# Patient Record
Sex: Female | Born: 1980 | Race: Black or African American | Hispanic: No | Marital: Single | Smoking: Former smoker
Health system: Southern US, Community
[De-identification: ages and names within clinical notes are randomized; demographics above are authoritative.]

## PROBLEM LIST (undated history)

## (undated) DIAGNOSIS — F419 Anxiety disorder, unspecified: Secondary | ICD-10-CM

## (undated) DIAGNOSIS — N809 Endometriosis, unspecified: Secondary | ICD-10-CM

## (undated) HISTORY — DX: Endometriosis, unspecified: N80.9

## (undated) HISTORY — PX: NO PAST SURGERIES: SHX2092

## (undated) HISTORY — PX: ABDOMINAL HYSTERECTOMY: SHX81

## (undated) HISTORY — DX: Anxiety disorder, unspecified: F41.9

---
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
## 2002-10-12 ENCOUNTER — Emergency Department (HOSPITAL_COMMUNITY): Admission: EM | Admit: 2002-10-12 | Discharge: 2002-10-12 | Payer: Self-pay | Admitting: Emergency Medicine

## 2006-11-02 ENCOUNTER — Encounter: Payer: Self-pay | Admitting: Emergency Medicine

## 2006-11-03 ENCOUNTER — Ambulatory Visit: Payer: Self-pay | Admitting: Internal Medicine

## 2006-11-03 ENCOUNTER — Inpatient Hospital Stay (HOSPITAL_COMMUNITY): Admission: AD | Admit: 2006-11-03 | Discharge: 2006-11-08 | Payer: Self-pay | Admitting: Obstetrics and Gynecology

## 2006-11-03 ENCOUNTER — Ambulatory Visit: Payer: Self-pay | Admitting: Obstetrics & Gynecology

## 2006-11-27 ENCOUNTER — Ambulatory Visit: Payer: Self-pay | Admitting: *Deleted

## 2006-12-13 ENCOUNTER — Emergency Department (HOSPITAL_COMMUNITY): Admission: EM | Admit: 2006-12-13 | Discharge: 2006-12-13 | Payer: Self-pay | Admitting: Emergency Medicine

## 2007-04-04 ENCOUNTER — Emergency Department (HOSPITAL_COMMUNITY): Admission: EM | Admit: 2007-04-04 | Discharge: 2007-04-04 | Payer: Self-pay | Admitting: Family Medicine

## 2007-08-13 ENCOUNTER — Ambulatory Visit: Payer: Self-pay | Admitting: Obstetrics and Gynecology

## 2007-09-01 ENCOUNTER — Emergency Department (HOSPITAL_COMMUNITY): Admission: EM | Admit: 2007-09-01 | Discharge: 2007-09-01 | Payer: Self-pay | Admitting: Emergency Medicine

## 2008-07-20 ENCOUNTER — Ambulatory Visit: Payer: Self-pay | Admitting: Obstetrics & Gynecology

## 2008-07-21 ENCOUNTER — Encounter: Payer: Self-pay | Admitting: Obstetrics and Gynecology

## 2010-05-08 LAB — POCT URINALYSIS DIP (DEVICE)
Bilirubin Urine: NEGATIVE
Ketones, ur: NEGATIVE mg/dL
Specific Gravity, Urine: 1.02 (ref 1.005–1.030)

## 2010-06-13 NOTE — Group Therapy Note (Signed)
[name], TOROSYAN NO.:  [id number]   MEDICAL RECORD NO.:  [id number]          PATIENT TYPE:  WOC   LOCATION:  [facility]                   FACILITY:  [facility]   PHYSICIAN:  Argentina Donovan, MD        DATE OF BIRTH:  1980/11/29   DATE OF SERVICE:  08/13/2007                                  CLINIC NOTE   This is a young Caucasian female at 30 years old, nulligravida, who was  hospitalized one year ago for treatment of pneumonia, Gonorrhea, urinary  tract infection  and pelvic inflammatory disease.  It was suspected  bilateral tubal ovarian abscesses.  Patient has been asymptomatic since  that time, but comes in wanting STD testing.  GC culture probe and wet  prep were done, and the patient will be sent for HIV, RPR and hepatitis  screen.  Ultrasound followup from last year to see if there is any  significant change at the patient's request.   IMPRESSION:  History of pelvic inflammatory disease for sexually  transmitted disease testing.           ______________________________  Argentina Donovan, MD     PR/MEDQ  D:  08/13/2007  T:  08/13/2007  Job:  [id number]

## 2010-06-13 NOTE — Group Therapy Note (Signed)
NAMELYLE, LEISNER NO.:  [id number]   MEDICAL RECORD NO.:  [id number]           PATIENT TYPE:   LOCATION:  [facility]                     FACILITY:   PHYSICIAN:  Karlton Lemon, MD      DATE OF BIRTH:  [birth year]   DATE OF SERVICE:  11/27/2006                                  CLINIC NOTE   CHIEF COMPLAINT:  Followup from hospitalization.   HISTORY OF PRESENT ILLNESS:  This is a 30 year old gravida 0 presenting  for followup from hospitalization from October 6 through October 10  involving treatment for pneumonia, gonorrhea, urinary tract infection  and pelvic inflammatory disease including suspected bilateral tubo-  ovarian abscesses. At time of discharge, it was determined that abnormal  findings on ultrasound and CT in the ovaries were likely bilateral  hydrosalpinges. The patient was treated with multiple antibiotics while  inpatient and had some time spent in the ICU due to her critical  condition. She was discharged on Levaquin for 5 more days post  hospitalization. The patient reports that she has been doing well and  denies current abdominal pain. The patient states that she did have some  vaginal bleeding lasting about four days that was described as spotting.  She is currently not having any vaginal bleeding. Her last menstrual  period was on October 16, 2006. The patient reports that she is having  very mild abdominal pain. She states that her partner was treated for  gonorrhea with an IM injection of antibiotics, likely Rocephin, but she  did have sex with him once prior to him being treated. She has had sex  with him a few times since his being treated. She has no other  complaints today. She denies fever, rash, nausea, vomiting, diarrhea or  abdominal pain.   PAST MEDICAL HISTORY:  Recently treated gonorrhea, pelvic inflammatory  disease, pneumonia, urinary tract infection. She has no other medical  problems.   PAST SURGICAL HISTORY:   None.   MEDICATIONS:  None currently.   ALLERGIES:  No known drug allergies.   SOCIAL HISTORY:  The patient does smoke a half a pack per day for  several years. She denies alcohol or drug use.   FAMILY HISTORY:  Noncontributory.   REVIEW OF SYSTEMS:  Review of systems is negative except for what is  stated above in the history of present illness.   PHYSICAL EXAMINATION:  GENERAL:  This is a well-appearing female in no  distress.  VITAL SIGNS:  Temperature is 97.8, pulse is 79, blood pressure 117/73.  CARDIOVASCULAR:  Heart is regular rate and rhythm. No murmurs, rubs or  gallops are appreciated.  RESPIRATORY:  Lungs are clear to auscultation bilaterally.  ABDOMEN:  Soft with very mild tenderness in the suprapubic region. There  is no rebound tenderness or guarding. No masses are palpated. Bowel  sounds are auscultated in all four quadrants.  GENITOURINARY:  Normal female external genitalia. Vaginal mucosa is pink  and moist. There is a whitish discharge noted. There is no discharge  from the cervical os. GC and chlamydia is collected from the cervical  os. On bimanual exam,  there is no cervical motion tenderness. Uterus is  normal size and midline. Adnexa are difficult to palpate but do not feel  enlarged.  EXTREMITIES:  No clubbing, cyanosis, or edema.   ASSESSMENT AND PLAN:  This is a 30 year old gravida 0 with history of  hydrosalpinges, pneumonia, urinary tract infection, gonorrhea during  recent hospitalization.  She has completed outpatient course of Levaquin  post hospitalization.   1. Cervix has been recultured today for gonorrhea and chlamydia.  2. Will check a CBC and ESR to monitor for any inflammation due to her      previous findings on CT and ultrasound suggestive of ovarian      abscesses, even though she was discharged with the diagnoses of      hydrosalpinges. We will follow her on an as needed basis.  3. The patient has been counseled to practice safe sex  with a      monogamous partner as this will decrease her likelihood of having a      recurrence of these infections, particularly gonorrhea and possible      pelvic inflammatory disease and tubo-ovarian abscess. The patient      expresses understanding of his plan.           ______________________________  Karlton Lemon, MD     NS/MEDQ  D:  11/27/2006  T:  11/28/2006  Job:  [id number]

## 2010-06-13 NOTE — Discharge Summary (Signed)
Megan Ponce, Megan Ponce              ACCOUNT NO.:  [id number]   MEDICAL RECORD NO.:  [id number]          PATIENT TYPE:  INP   LOCATION:  9304                          FACILITY:  [facility]   PHYSICIAN:  Norton Blizzard, MD    DATE OF BIRTH:  24-Feb-1980   DATE OF ADMISSION:  11/03/2006  DATE OF DISCHARGE:                               DISCHARGE SUMMARY   ADMISSION DIAGNOSES:  1. Bilateral tuboovarian abscess.  2. Pelvic inflammatory disease.   DISCHARGE DIAGNOSES:  1. Bilateral pyosalpinges.  2. Pneumonia.  3. Urinary tract infections.  4. Gonorrhea infection.   HISTORY OF PRESENT ILLNESS:  The patient is a 30 year old G0 who  presented to the ED on 10/5, with complaint of lower abdominal pain.  She was noted to have bilateral adnexal masses concerning for a TOA  on  a CT scan and labs were remarkable for a white count of 32.8 with a left  shift.  Of note, she was also noted to have cervical motion tenderness.  Given this concern for her pain and possible tuboovarian abscess, she  was admitted to the hospital for further management and evaluation.   PAST MEDICAL HISTORY:  None.   PAST SURGICAL HISTORY:  None.   PAST OB HISTORY:  The patient has a history of Chlamydia 4 years ago,  abnormal Pap smear in March 2008.  Does not use any contraception.   MEDICATIONS:  None.   ALLERGIES:  NO KNOWN DRUG ALLERGIES.   SOCIAL HISTORY:  Smoking half pack per day for many years.  No alcohol  or drugs.   FAMILY HISTORY:  Noncontributory.   ADMISSION PHYSICAL EXAM:  Temperature 98.4, blood pressure 118/20, pulse  100 and oxygen saturation 98%.  In general in no apparent distress.  Heart regular rate and rhythm.  Lungs clear to auscultation bilaterally.  Abdomen soft, tenderness to palpation bilateral lower quadrants, no  rebound or guarding.  Positive bowel sounds.  Extremities are  symmetrical, nontender.  On pelvic exam, positive cervical motion  tenderness.  Adnexa tender to palpation,  right greater than left,  midline tenderness, malodorous discharge also noted.   PERTINENT ADMISSION LABS:  White count of 32.8 with 96% neutrophils.  Urine HCG negative.  UA with positive nitrites.  Wet prep with many clue  cells.  CT scan shows bilateral thick adnexal lesions and thickened tubal walls  suspicious for TOA.   HOSPITAL COURSE:  The patient was hospitalized on 11/03/06.  She was  initially started on Cefotetan, Doxycycline and Flagyl for treatment of  her TOA, presumed UTI and bacterial vaginosis.  On 11/04/06, the patient  was noted to have oxygen saturation in the high 80s to low 90s,  persistent tachycardia and a fever to 100.7.  Given these symptoms, a  chest  CT scan was obtained which was negative for a pulmonary embolus  but positive for a right upper and right lower lobe consolidation giving  the patient a new diagnosis of pneumonia.  Moreover, her cervical  cultures were positive for gonorrhea and her urine culture was positive  an E. coli UTI.  The  patient received Ceftriaxone 125 mg IM x 1 for her  gonorrhea, and her antibotics were changed to Levofloxacin, Gentamicin  and Flagyl. Given concern for her multiple infections and unstable vital  signs, she was moved to the intensive care unit for close monitoring.  In The ICU, a Critical Care consult was obtained; Dr. Sherene Sires recommended  oxygen therapy as needed to maintain adequate saturation and IV Zosyn as  monotherapy for her multiple infectious processes. The patient was  maintained on IV antibiotics for over 48 hours, during which time she  remained afebrile.  The patient's condition did improve and she was  transferred  back to the floor on 11/07/06.  At the time of discharge on  the10/9/08,  she had been afebrile for over 48 hours.  She had minimal  pain and was maintaining oxygen saturations above 94% on room air.  The  plan as per Dr. Sherene Sires was to continue her Levofloxacin for 5 more days  after discharge.    CONSULTANTS:  Critical care.   PROCEDURES:  None.   DISCHARGE MEDICATIONS:  1. Levofloxacin 750 mg orally daily x 5 days.  2. Vicodin 5/325 mg 1 to 2 tabs orally q. 4 hours p.r.n. pain.  3. Motrin 800 mg orally q. 8 hours p.r.n., pain.   DISCHARGE INSTRUCTIONS:  The patient was instructed to follow up in the  GYN clinic in 2 weeks.  She was given the number to the GYN clinic to  make an appointment to be seen in followup.  She was also instructed  about the impact of her pyosalpinges on her possible infertility in the  future, and probable need for surgery in the future.  Of note, the  patient was instructed to have her boyfriend treated for gonorrhea and  at the time of discharge she did report that her boyfriend had been to  the health department and received appropriate treatment.  The patient  was told to call for worsening shortness of breath, fevers or any other  concerns.      Norton Blizzard, MD  Electronically Signed     UAD/MEDQ  D:  11/08/2006  T:  11/08/2006  Job:  [id number]

## 2010-10-27 LAB — CBC
HCT: 37.6
MCV: 91.4
Platelets: 235
RBC: 4.12
RDW: 12.6

## 2010-10-27 LAB — URINALYSIS, ROUTINE W REFLEX MICROSCOPIC
Glucose, UA: NEGATIVE
Nitrite: POSITIVE — AB
Protein, ur: 100 — AB
Specific Gravity, Urine: 1.036 — ABNORMAL HIGH
Urobilinogen, UA: 1

## 2010-10-27 LAB — URINE MICROSCOPIC-ADD ON

## 2010-10-27 LAB — URINE CULTURE

## 2010-10-27 LAB — POCT I-STAT, CHEM 8
BUN: 16
Creatinine, Ser: 1.1
TCO2: 20

## 2010-10-27 LAB — DIFFERENTIAL
Basophils Absolute: 0
Basophils Relative: 0
Eosinophils Absolute: 0
Lymphs Abs: 1.2
Neutro Abs: 16.7 — ABNORMAL HIGH
Neutrophils Relative %: 88 — ABNORMAL HIGH

## 2010-10-27 LAB — POCT PREGNANCY, URINE: Preg Test, Ur: NEGATIVE

## 2010-11-07 LAB — CULTURE, ROUTINE-ABSCESS

## 2010-11-08 LAB — POCT URINALYSIS DIP (DEVICE)
Glucose, UA: NEGATIVE
Nitrite: NEGATIVE
Operator id: 297281
Specific Gravity, Urine: >=1.03
Urobilinogen, UA: 0.2

## 2010-11-09 LAB — DIFFERENTIAL
Basophils Absolute: 0
Basophils Absolute: 0
Basophils Absolute: 0
Basophils Relative: 0
Basophils Relative: 0
Eosinophils Relative: 0
Lymphocytes Relative: 3 — ABNORMAL LOW
Lymphocytes Relative: 4 — ABNORMAL LOW
Lymphs Abs: 0.6 — ABNORMAL LOW
Lymphs Abs: 0.7
Monocytes Absolute: 0.3
Monocytes Absolute: 0.5
Monocytes Absolute: 0.7
Neutro Abs: 16.2 — ABNORMAL HIGH
Neutro Abs: 22.7 — ABNORMAL HIGH
Neutro Abs: 31.4 — ABNORMAL HIGH
Neutro Abs: 6.3
WBC Morphology: INCREASED

## 2010-11-09 LAB — HEPATITIS B E ANTIGEN: Hep B E Ag: NEGATIVE

## 2010-11-09 LAB — CBC
HCT: 28.5 — ABNORMAL LOW
HCT: 40.7
Hemoglobin: 11.3 — ABNORMAL LOW
Hemoglobin: 12.1
Hemoglobin: 13.7
Hemoglobin: 9.8 — ABNORMAL LOW
MCV: 90.3
MCV: 90.4
Platelets: 320
RBC: 3.17 — ABNORMAL LOW
RBC: 3.98
RDW: 13.1
RDW: 13.3
WBC: 17.5 — ABNORMAL HIGH
WBC: 23.5 — ABNORMAL HIGH
WBC: 29.4 — ABNORMAL HIGH
WBC: 8

## 2010-11-09 LAB — AMYLASE: Amylase: 65

## 2010-11-09 LAB — COMPREHENSIVE METABOLIC PANEL
Albumin: 2.2 — ABNORMAL LOW
Alkaline Phosphatase: 49
BUN: <1 — ABNORMAL LOW
Chloride: 105
Glucose, Bld: 114 — ABNORMAL HIGH
Potassium: 3.5
Total Bilirubin: 0.6

## 2010-11-09 LAB — URINE CULTURE

## 2010-11-09 LAB — URINALYSIS, ROUTINE W REFLEX MICROSCOPIC
Bilirubin Urine: NEGATIVE
Glucose, UA: NEGATIVE
Ketones, ur: 15 — AB
Protein, ur: 100 — AB
Specific Gravity, Urine: 1.023

## 2010-11-09 LAB — RPR: RPR Ser Ql: NONREACTIVE

## 2010-11-09 LAB — HIV ANTIBODY (ROUTINE TESTING W REFLEX): HIV: NONREACTIVE

## 2010-11-09 LAB — I-STAT 8, (EC8 V) (CONVERTED LAB)
BUN: 11
Bicarbonate: 25.2 — ABNORMAL HIGH
Chloride: 105
HCT: 45
Operator id: 272551
Potassium: 3.9
pH, Ven: 7.345 — ABNORMAL HIGH

## 2010-11-09 LAB — GC/CHLAMYDIA PROBE AMP, GENITAL
Chlamydia, DNA Probe: NEGATIVE
GC Probe Amp, Genital: POSITIVE — AB

## 2010-11-09 LAB — CULTURE, RESPIRATORY W GRAM STAIN
Culture: NORMAL
Gram Stain: NONE SEEN

## 2010-11-09 LAB — POCT I-STAT CREATININE: Operator id: 272551

## 2010-11-09 LAB — PREGNANCY, URINE: Preg Test, Ur: NEGATIVE

## 2010-11-09 LAB — WET PREP, GENITAL
Trich, Wet Prep: NONE SEEN
Yeast Wet Prep HPF POC: NONE SEEN

## 2010-11-09 LAB — URINE MICROSCOPIC-ADD ON

## 2010-11-09 LAB — RAPID URINE DRUG SCREEN, HOSP PERFORMED: Tetrahydrocannabinol: NOT DETECTED

## 2014-05-23 ENCOUNTER — Emergency Department (HOSPITAL_COMMUNITY)
Admission: EM | Admit: 2014-05-23 | Discharge: 2014-05-23 | Disposition: A | Discharge status: Home / Self Care | Payer: Self-pay | Attending: Emergency Medicine | Admitting: Emergency Medicine

## 2014-05-23 ENCOUNTER — Emergency Department (HOSPITAL_COMMUNITY): Payer: Self-pay

## 2014-05-23 ENCOUNTER — Encounter (HOSPITAL_COMMUNITY): Payer: Self-pay | Admitting: *Deleted

## 2014-05-23 DIAGNOSIS — K0889 Other specified disorders of teeth and supporting structures: Secondary | ICD-10-CM

## 2014-05-23 DIAGNOSIS — K5900 Constipation, unspecified: Secondary | ICD-10-CM | POA: Insufficient documentation

## 2014-05-23 DIAGNOSIS — K088 Other specified disorders of teeth and supporting structures: Secondary | ICD-10-CM | POA: Insufficient documentation

## 2014-05-23 DIAGNOSIS — N390 Urinary tract infection, site not specified: Secondary | ICD-10-CM | POA: Insufficient documentation

## 2014-05-23 DIAGNOSIS — R109 Unspecified abdominal pain: Secondary | ICD-10-CM

## 2014-05-23 DIAGNOSIS — R220 Localized swelling, mass and lump, head: Secondary | ICD-10-CM

## 2014-05-23 DIAGNOSIS — Z720 Tobacco use: Secondary | ICD-10-CM | POA: Insufficient documentation

## 2014-05-23 DIAGNOSIS — Z3202 Encounter for pregnancy test, result negative: Secondary | ICD-10-CM | POA: Insufficient documentation

## 2014-05-23 LAB — COMPREHENSIVE METABOLIC PANEL
ALT: 17 U/L (ref 0–35)
ANION GAP: 10 (ref 5–15)
AST: 18 U/L (ref 0–37)
Albumin: 3.6 g/dL (ref 3.5–5.2)
Alkaline Phosphatase: 60 U/L (ref 39–117)
BUN: 9 mg/dL (ref 6–23)
CALCIUM: 8.7 mg/dL (ref 8.4–10.5)
CHLORIDE: 108 mmol/L (ref 96–112)
CO2: 19 mmol/L (ref 19–32)
CREATININE: 0.85 mg/dL (ref 0.50–1.10)
GFR calc non Af Amer: 89 mL/min — ABNORMAL LOW (ref >90)
Glucose, Bld: 86 mg/dL (ref 70–99)
Potassium: 4 mmol/L (ref 3.5–5.1)
SODIUM: 137 mmol/L (ref 135–145)
Total Bilirubin: 0.7 mg/dL (ref 0.3–1.2)
Total Protein: 7 g/dL (ref 6.0–8.3)

## 2014-05-23 LAB — URINALYSIS, ROUTINE W REFLEX MICROSCOPIC
Bilirubin Urine: NEGATIVE
GLUCOSE, UA: NEGATIVE mg/dL
HGB URINE DIPSTICK: NEGATIVE
KETONES UR: NEGATIVE mg/dL
Leukocytes, UA: NEGATIVE
Nitrite: POSITIVE — AB
PH: 6 (ref 5.0–8.0)
PROTEIN: NEGATIVE mg/dL
Specific Gravity, Urine: 1.021 (ref 1.005–1.030)
UROBILINOGEN UA: 0.2 mg/dL (ref 0.0–1.0)

## 2014-05-23 LAB — CBC WITH DIFFERENTIAL/PLATELET
BASOS ABS: 0 10*3/uL (ref 0.0–0.1)
Basophils Relative: 0 % (ref 0–1)
EOS ABS: 0.1 10*3/uL (ref 0.0–0.7)
EOS PCT: 1 % (ref 0–5)
HEMATOCRIT: 37.8 % (ref 36.0–46.0)
Hemoglobin: 12.8 g/dL (ref 12.0–15.0)
Lymphocytes Relative: 14 % (ref 12–46)
Lymphs Abs: 1.5 10*3/uL (ref 0.7–4.0)
MCH: 30.2 pg (ref 26.0–34.0)
MCHC: 33.9 g/dL (ref 30.0–36.0)
MCV: 89.2 fL (ref 78.0–100.0)
MONOS PCT: 6 % (ref 3–12)
Monocytes Absolute: 0.6 10*3/uL (ref 0.1–1.0)
NEUTROS ABS: 8.8 10*3/uL — AB (ref 1.7–7.7)
NEUTROS PCT: 79 % — AB (ref 43–77)
Platelets: 314 10*3/uL (ref 150–400)
RBC: 4.24 MIL/uL (ref 3.87–5.11)
RDW: 12.9 % (ref 11.5–15.5)
WBC: 11 10*3/uL — AB (ref 4.0–10.5)

## 2014-05-23 LAB — URINE MICROSCOPIC-ADD ON

## 2014-05-23 LAB — POC URINE PREG, ED: PREG TEST UR: NEGATIVE

## 2014-05-23 MED ORDER — POLYETHYLENE GLYCOL 3350 17 GM/SCOOP PO POWD: 17.0000 g | Freq: Two times a day (BID) | ORAL | Status: DC

## 2014-05-23 MED ORDER — CEPHALEXIN 500 MG PO CAPS: 500.0000 mg | ORAL_CAPSULE | Freq: Four times a day (QID) | ORAL | Status: DC

## 2014-05-23 MED ORDER — ACETAMINOPHEN 500 MG PO TABS
500.0000 mg | ORAL_TABLET | Freq: Once | ORAL | Status: AC
  Administered 2014-05-23: 500 mg via ORAL
  Filled 2014-05-23: qty 1

## 2014-05-23 NOTE — Discharge Instructions (Signed)
Dental Pain A tooth ache may be caused by cavities (tooth decay). Cavities expose the nerve of the tooth to air and hot or cold temperatures. It may come from an infection or abscess (also called a boil or furuncle) around your tooth. It is also often caused by dental caries (tooth decay). This causes the pain you are having. DIAGNOSIS  Your caregiver can diagnose this problem by exam. TREATMENT   If caused by an infection, it may be treated with medications which kill germs (antibiotics) and pain medications as prescribed by your caregiver. Take medications as directed.  Only take over-the-counter or prescription medicines for pain, discomfort, or fever as directed by your caregiver.  Whether the tooth ache today is caused by infection or dental disease, you should see your dentist as soon as possible for further care. SEEK MEDICAL CARE IF: The exam and treatment you received today has been provided on an emergency basis only. This is not a substitute for complete medical or dental care. If your problem worsens or new problems (symptoms) appear, and you are unable to meet with your dentist, call or return to this location. SEEK IMMEDIATE MEDICAL CARE IF:   You have a fever.  You develop redness and swelling of your face, jaw, or neck.  You are unable to open your mouth.  You have severe pain uncontrolled by pain medicine. MAKE SURE YOU:   Understand these instructions.  Will watch your condition.  Will get help right away if you are not doing well or get worse. Document Released: 01/15/2005 Document Revised: 04/09/2011 Document Reviewed: 09/03/2007 [facility] Patient Information 2015 [place], [place]. This information is not intended to replace advice given to you by your health care provider. Make sure you discuss any questions you have with your health care provider. Constipation Constipation is when a person has fewer than three bowel movements a week, has difficulty having a bowel  movement, or has stools that are dry, hard, or larger than normal. As people grow older, constipation is more common. If you try to fix constipation with medicines that make you have a bowel movement (laxatives), the problem may get worse. Long-term laxative use may cause the muscles of the colon to become weak. A low-fiber diet, not taking in enough fluids, and taking certain medicines may make constipation worse.  CAUSES   Certain medicines, such as antidepressants, pain medicine, iron supplements, antacids, and water pills.   Certain diseases, such as diabetes, irritable bowel syndrome (IBS), thyroid disease, or depression.   Not drinking enough water.   Not eating enough fiber-rich foods.   Stress or travel.   Lack of physical activity or exercise.   Ignoring the urge to have a bowel movement.   Using laxatives too much.  SIGNS AND SYMPTOMS   Having fewer than three bowel movements a week.   Straining to have a bowel movement.   Having stools that are hard, dry, or larger than normal.   Feeling full or bloated.   Pain in the lower abdomen.   Not feeling relief after having a bowel movement.  DIAGNOSIS  Your health care provider will take a medical history and perform a physical exam. Further testing may be done for severe constipation. Some tests may include:  A barium enema X-ray to examine your rectum, colon, and, sometimes, your small intestine.   A sigmoidoscopy to examine your lower colon.   A colonoscopy to examine your entire colon. TREATMENT  Treatment will depend on the severity of  your constipation and what is causing it. Some dietary treatments include drinking more fluids and eating more fiber-rich foods. Lifestyle treatments may include regular exercise. If these diet and lifestyle recommendations do not help, your health care provider may recommend taking over-the-counter laxative medicines to help you have bowel movements. Prescription  medicines may be prescribed if over-the-counter medicines do not work.  HOME CARE INSTRUCTIONS   Eat foods that have a lot of fiber, such as fruits, vegetables, whole grains, and beans.  Limit foods high in fat and processed sugars, such as french fries, hamburgers, cookies, candies, and soda.   A fiber supplement may be added to your diet if you cannot get enough fiber from foods.   Drink enough fluids to keep your urine clear or pale yellow.   Exercise regularly or as directed by your health care provider.   Go to the restroom when you have the urge to go. Do not hold it.   Only take over-the-counter or prescription medicines as directed by your health care provider. Do not take other medicines for constipation without talking to your health care provider first.  SEEK IMMEDIATE MEDICAL CARE IF:   You have bright red blood in your stool.   Your constipation lasts for more than 4 days or gets worse.   You have abdominal or rectal pain.   You have thin, pencil-like stools.   You have unexplained weight loss. MAKE SURE YOU:   Understand these instructions.  Will watch your condition.  Will get help right away if you are not doing well or get worse. Document Released: 10/14/2003 Document Revised: 01/20/2013 Document Reviewed: 10/27/2012 [facility] Patient Information 2015 [place], [place]. This information is not intended to replace advice given to you by your health care provider. Make sure you discuss any questions you have with your health care provider. Urinary Tract Infection Urinary tract infections (UTIs) can develop anywhere along your urinary tract. Your urinary tract is your body's drainage system for removing wastes and extra water. Your urinary tract includes two kidneys, two ureters, a bladder, and a urethra. Your kidneys are a pair of bean-shaped organs. Each kidney is about the size of your fist. They are located below your ribs, one on each side of your  spine. CAUSES Infections are caused by microbes, which are microscopic organisms, including fungi, viruses, and bacteria. These organisms are so small that they can only be seen through a microscope. Bacteria are the microbes that most commonly cause UTIs. SYMPTOMS  Symptoms of UTIs may vary by age and gender of the patient and by the location of the infection. Symptoms in young women typically include a frequent and intense urge to urinate and a painful, burning feeling in the bladder or urethra during urination. Older women and men are more likely to be tired, shaky, and weak and have muscle aches and abdominal pain. A fever may mean the infection is in your kidneys. Other symptoms of a kidney infection include pain in your back or sides below the ribs, nausea, and vomiting. DIAGNOSIS To diagnose a UTI, your caregiver will ask you about your symptoms. Your caregiver also will ask to provide a urine sample. The urine sample will be tested for bacteria and white blood cells. White blood cells are made by your body to help fight infection. TREATMENT  Typically, UTIs can be treated with medication. Because most UTIs are caused by a bacterial infection, they usually can be treated with the use of antibiotics. The choice of antibiotic  and length of treatment depend on your symptoms and the type of bacteria causing your infection. HOME CARE INSTRUCTIONS  If you were prescribed antibiotics, take them exactly as your caregiver instructs you. Finish the medication even if you feel better after you have only taken some of the medication.  Drink enough water and fluids to keep your urine clear or pale yellow.  Avoid caffeine, tea, and carbonated beverages. They tend to irritate your bladder.  Empty your bladder often. Avoid holding urine for long periods of time.  Empty your bladder before and after sexual intercourse.  After a bowel movement, women should cleanse from front to back. Use each tissue only  once. SEEK MEDICAL CARE IF:   You have back pain.  You develop a fever.  Your symptoms do not begin to resolve within 3 days. SEEK IMMEDIATE MEDICAL CARE IF:   You have severe back pain or lower abdominal pain.  You develop chills.  You have nausea or vomiting.  You have continued burning or discomfort with urination. MAKE SURE YOU:   Understand these instructions.  Will watch your condition.  Will get help right away if you are not doing well or get worse. Document Released: 10/25/2004 Document Revised: 07/17/2011 Document Reviewed: 02/23/2011 [facility] Patient Information 2015 [place]. This information is not intended to replace advice given to you by your health care provider. Make sure you discuss any questions you have with your health care provider.   Emergency Department Resource Guide 1) Find a Doctor and Pay Out of Pocket Although you won't have to find out who is covered by your insurance plan, it is a good idea to ask around and get recommendations. You will then need to call the office and see if the doctor you have chosen will accept you as a new patient and what types of options they offer for patients who are self-pay. Some doctors offer discounts or will set up payment plans for their patients who do not have insurance, but you will need to ask so you aren't surprised when you get to your appointment.  2) Contact Your Local Health Department Not all health departments have doctors that can see patients for sick visits, but many do, so it is worth a call to see if yours does. If you don't know where your local health department is, you can check in your phone book. The CDC also has a tool to help you locate your state's health department, and many state websites also have listings of all of their local health departments.  3) Find a Walk-in Clinic If your illness is not likely to be very severe or complicated, you may want to try a walk in clinic. These are  popping up all over the country in pharmacies, drugstores, and shopping centers. They're usually staffed by nurse practitioners or physician assistants that have been trained to treat common illnesses and complaints. They're usually fairly quick and inexpensive. However, if you have serious medical issues or chronic medical problems, these are probably not your best option.  No Primary Care Doctor: - Call Health Connect at  [phone] - they can help you locate a primary care doctor that  accepts your insurance, provides certain services, etc. - Physician Referral Service- [phone]  Chronic Pain Problems: Organization         Address  Phone   Notes  [facility]  [phone] Patients need to be referred by their primary care doctor.   Medication  Assistance: Organization         Address  Phone   Notes  [facility] Medication Assistance Program [address] [place] [place], [place] [address] [phone] --Must be a resident of [facility] -- Must have NO insurance coverage whatsoever (no Medicaid/ Medicare, etc.) -- The pt. MUST have a primary care doctor that directs their care regularly and follows them in the community   [facility]  [phone]   [employer]  [phone]    Agencies that provide inexpensive medical care: Organization         Address  Phone   Notes  [facility]  [phone]   [facility]    [phone]   [facility] [address] [place], [place] [address] [phone]   [facility] [address] [place] [place] [phone]   Planned Parenthood    [phone]   [facility]    [phone]   [facility]  [address], [place] Phone:  [phone], Fax:  [phone] Hours of Operation:  9 am - 6 pm, M-F.  Also accepts Medicaid/Medicare and self-pay.  [facility]  [address], [place] Phone: [phone], Fax: [phone]. Hours of Operation:  8:30 am - 5:30 pm, M-F.  Also accepts Medicaid and self-pay.  [facility] [place] [address] [place] Phone: [phone]   [facility] [address] Natasha Bence [place], [place] [phone], Ext. [phone] Mondays & Thursdays: 7-9 AM.  First 15 patients are seen on a first come, first serve basis.    Medicaid-accepting [facility] Providers:  Organization         Address  Phone   Notes  [name] [address], [place] [phone] Also accepts self-pay patients.  [facility] [address] Laurell Josephs [place], [place]  [phone]   [facility] [address], [place] [phone]   [facility] [address], [place] [phone]   Renaye Rakers [address], [place]   [phone] Only accepts [place] Access [place] patients after they have their name applied to their card.   Self-Pay (no insurance) in [facility]:  Organization         Address  Phone   Notes  Sickle Cell Patients, [facility] [address] [place], [place] [phone]   [facility] [address] [place], [place] [phone]   [facility]  [address], [place] [phone]   [facility]/Dr. Osei-Bonsu  [address] [place] or [address], [place] [phone] Phone number for both [place] and [place] locations is the same.  [facility] [address], [place] [phone]   [facility] [address] [place] or [address] [place] [address] [phone]   [facility] [place] [address], [place] [phone], phone; [phone], fax Sees patients 1st and 3rd Saturday  of every month.  Must not qualify for public or private insurance (i.e. Medicaid, Medicare, [place] Health Choice, Veterans' Benefits)  Household income should be no more than 200% of the poverty level The clinic  cannot treat you if you are pregnant or think you are pregnant  Sexually transmitted diseases are not treated at the clinic.    Dental Care: Organization         Address  Phone  Notes  [facility] Department of [facility] [address] [place], [place] [phone] Accepts children up to age 66 who are enrolled in [place] or [place] Health Choice; pregnant women with a Medicaid card; and children who have applied for Medicaid or [place] Health Choice, but were declined, whose parents can pay a reduced fee at time of service.  [facility] Department of [facility]  [address], [place] [phone] Accepts children up to age 76 who are enrolled in [place] or [place] Health Choice; pregnant women with a Medicaid card; and children who have applied for Medicaid or [place] Health Choice, but were declined, whose parents can pay a reduced fee at time of service.  [facility]  [address] [place], [place] [phone] Patients are seen by appointment only. Walk-ins are not accepted. [facility] will see patients 50 years of age and older. Monday - Tuesday (8am-5pm) Most Wednesdays (8:30-5pm) $30 per visit, cash only  [facility]  [address], [place] [facility] [phone] Patients are seen by appointment only. Walk-ins are not accepted. [facility] will see patients 58 years of age and older. One Wednesday Evening (Monthly: Volunteer Based).  $30 per visit, cash only  [employer]  [phone] for adults; Children under age 46, call Graduate Pediatric Dentistry at [phone]. Children aged 33-14, please call [phone] to request a pediatric application.   Dental services are provided in all areas of dental care including fillings, crowns and bridges, complete and partial dentures, implants, gum treatment, root canals, and extractions. Preventive care is also provided. Treatment is provided to both adults and children. Patients are selected via a lottery and there is often a waiting list.   [facility] [address] [place]  [phone] [email or web address]   [facility] [address] [place], [place] [phone], Ext. [phone] Second and Fourth Thursday of each month, opens at 6:30 AM; Clinic ends at 9 AM.  Patients are seen on a first-come first-served basis, and a limited number are seen during each clinic.   [facility]  [address] [place] [employer], [place] [phone]   Eligibility Requirements You must have lived in [place], [place], or [place] counties for at least the last three months.   You cannot be eligible for state or federal sponsored [place], including [employer], [place], or [employer].   You generally cannot be eligible for healthcare insurance through your employer.    How to apply: Eligibility screenings are held every Tuesday and Wednesday afternoon from 1:00 pm until 4:00 pm. You do not need an appointment for the interview!  [facility] [address], [place], [place] [phone]   [facility] Health Department  [phone]   [facility] Health Department  [phone]   [facility] Health Department  [phone]    Behavioral Health Resources in the Community: Intensive Outpatient Programs Organization         Address  Phone  Notes  [facility] Services [address], [place], [place] [phone]   [facility] [address], [place], [place] [phone]  [facility] [address] [place], [place]  [phone]   [facility] [address],  [place], [place] [phone] or [phone]   Substance Abuse Resources Organization         Address  Phone  Notes  [facility]  [phone]   [facility]  [phone]   [facility] [place]  [phone]   Floydene Flock  [phone]   Residential & Outpatient Substance Abuse Program  [phone]   Psychological Services Organization         Address  Phone  Notes  [facility] Behavioral Health  [phone]   [facility]  [phone]   [facility] [address] [place] [phone] or [phone]    Mobile Crisis Teams Organization         Address  Phone  Notes  [facility], Mobile Crisis Care Unit  [phone]   [facility]  [address] [place], [place] [phone]   Doristine Locks [address] [place] [phone]    Self-Help/Support Groups Organization         Address  Phone             Notes  [facility] [place] - variety of support groups  336- [phone] Call for more information  Narcotics Anonymous (NA), [facility] [address], [employer] [place]  2 meetings at this location   Statistician         Address  Phone  Notes  [facility] [address],    [place]  [phone]   [facility]  [address], [place] [address], [place], [place] [phone]   [facility] [address] [place], [place] [phone] Admissions: 8am-3pm M-F  [facility] [address],    [place], [place] [phone]   [facility] [address] [place], [place], [place] [phone]   [facility] [address],  [place], [place] [phone]   [facility] - Intensive Outpatient [address] Laurell [address], [place], [place] [phone]   [facility] ([facility] [address] [place]  [place], [place]  [phone] or [phone]   [facility] ([facility]) [address] [place], [place] [phone] Accepts Medicaid  [facility] [place] [address],  [place] [phone] Substance Abuse/Addiction Treatment   [facility] Organization         Address  Phone  Notes  [facility]  [phone]   Angie Fava, PhD [address] Ervin Knack [place], [place]   [phone] or [phone]   [facility] Behavioral   [address] [place], [place] [phone]   [facility] [address] [place], [place] [phone] Insurance/Medicaid/sponsorship through [facility] and Families [address]                                    [place], [place] [phone] Therapy/tele-psych/case  [facility] [address], [place] [phone]    Dr. Lolly Mustache  [phone]   [facility] [place]  [employer] [facility] Dept. 1) [address], [place] 2) [address] [place] 3)  [address], [place] [phone]  [phone]  [place] [phone] or [phone] (After Hours)

## 2014-05-23 NOTE — ED Provider Notes (Signed)
CSN: [id number]     Arrival date & time 05/23/14  0732 History   First MD Initiated Contact with Patient 05/23/14 [phone]     Chief Complaint  Patient presents with  . Abdominal Pain  . Dental Pain   Megan Ponce is a 34 y.o. female who is a smoker who presents the emergency department complaining of right-sided dental pain with facial swelling and mid abdominal pain since yesterday. Patient reports she started having some mild dental pain and facial swelling to the right side of her face 2 days ago. She reports taking Tylenol and ibuprofen earlier today and reports now her pain is improved. She is complaining of right upper dental pain earlier today. Patient is also complaining of 5 out of 10 mid abdominal pain. She reports she has not had a bowel movement in 2 weeks has been constipated. She reports she has been passing gas. The patient denies any nausea, vomiting or diarrhea. She reports her last cycles 04/13/2014 and her cycles are often irregular. The patient denies fevers, chills, sore throat, difficulty swallowing, nausea, vomiting, diarrhea, vaginal bleeding, vaginal discharge, discharge from her mouth, or rashes.  (Consider location/radiation/quality/duration/timing/severity/associated sxs/prior Treatment) HPI  History reviewed. No pertinent past medical history. History reviewed. No pertinent past surgical history. History reviewed. No pertinent family history. History  Substance Use Topics  . Smoking status: Current Every Day Smoker    Types: Cigarettes  . Smokeless tobacco: Not on file  . Alcohol Use: No   OB History    No data available     Review of Systems  Constitutional: Negative for fever and chills.  HENT: Positive for dental problem and facial swelling. Negative for congestion, ear pain, rhinorrhea, sore throat and trouble swallowing.   Eyes: Negative for redness and visual disturbance.  Respiratory: Negative for cough, shortness of breath and wheezing.    Cardiovascular: Negative for chest pain and palpitations.  Gastrointestinal: Positive for abdominal pain and constipation. Negative for nausea, vomiting, diarrhea and blood in stool.  Genitourinary: Negative for dysuria, urgency, frequency, hematuria, flank pain, vaginal bleeding, vaginal discharge, difficulty urinating and genital sores.  Musculoskeletal: Negative for back pain and neck pain.  Skin: Negative for rash.  Neurological: Negative for dizziness, facial asymmetry, weakness, light-headedness, numbness and headaches.      Allergies  Review of patient's allergies indicates no known allergies.  Home Medications   Prior to Admission medications   Medication Sig Start Date End Date Taking? Authorizing Provider  acetaminophen (TYLENOL) 325 MG tablet Take 650 mg by mouth every 6 (six) hours as needed.   Yes Historical Provider, MD  ibuprofen (ADVIL,MOTRIN) 400 MG tablet Take 400 mg by mouth every 6 (six) hours as needed for mild pain or moderate pain.   Yes Historical Provider, MD  cephALEXin (KEFLEX) 500 MG capsule Take 1 capsule (500 mg total) by mouth 4 (four) times daily. 05/23/14   Everlene Farrier, PA-C  polyethylene glycol powder (GLYCOLAX/MIRALAX) powder Take 17 g by mouth 2 (two) times daily. Until daily soft stools  OTC 05/23/14   Everlene Farrier, PA-C   BP 101/64 mmHg  Pulse 77  Temp(Src) 98.5 F (36.9 C) (Oral)  Resp 16  Ht  (1.651 m)  Wt 183 lb (83.008 kg)  BMI 30.45 kg/m2  SpO2 99%  LMP 04/13/2014 Physical Exam  Constitutional: She is oriented to person, place, and time. She appears well-developed and well-nourished. No distress.  Nontoxic appearing.  HENT:  Head: Normocephalic and atraumatic.  Right Ear:  External ear normal.  Left Ear: External ear normal.  Nose: Nose normal.  Mouth/Throat: Oropharynx is clear and moist. No oropharyngeal exudate.  Patient has a mild amount of edema in her right cheek that feels indurated without fluctuance. There is no  overlying erythema. The patient has a cracked right upper molar with tenderness to palpation. This is likely the source of the edema. Is no discharge from mouth. There is no sign of abscess. Her oropharynx is clear without tonsillar hypertrophy or exudates. Her soft palate rises symmetrically. Uvula is midline without edema. Bilateral tympanic membranes are pearly-gray without erythema or loss of landmarks.  Eyes: Conjunctivae and EOM are normal. Pupils are equal, round, and reactive to light. Right eye exhibits no discharge. Left eye exhibits no discharge.  Neck: Normal range of motion. Neck supple. No JVD present. No tracheal deviation present.  Cardiovascular: Normal rate, regular rhythm, normal heart sounds and intact distal pulses.  Exam reveals no gallop and no friction rub.   No murmur heard. Pulmonary/Chest: Effort normal and breath sounds normal. No respiratory distress. She has no wheezes. She has no rales.  Abdominal: Soft. She exhibits no distension. There is tenderness. There is no rebound and no guarding.  Patient's bowel sounds are hypoactive. His abdomen is soft. Patient has generalized abdominal tenderness without focal tenderness. No rebound tenderness. Negative Rovsing sign. Negative psoas or obturator sign  Musculoskeletal: She exhibits no edema.  Lymphadenopathy:    She has no cervical adenopathy.  Neurological: She is alert and oriented to person, place, and time. No cranial nerve deficit. Coordination normal.  Skin: Skin is warm and dry. No rash noted. She is not diaphoretic. No erythema. No pallor.  Psychiatric: She has a normal mood and affect. Her behavior is normal.  Nursing note and vitals reviewed.   ED Course  Procedures (including critical care time) Labs Review Labs Reviewed  COMPREHENSIVE METABOLIC PANEL - Abnormal; Notable for the following:    GFR calc non Af Amer 89 (*)    All other components within normal limits  CBC WITH DIFFERENTIAL/PLATELET - Abnormal;  Notable for the following:    WBC 11.0 (*)    Neutrophils Relative % 79 (*)    Neutro Abs 8.8 (*)    All other components within normal limits  URINALYSIS, ROUTINE W REFLEX MICROSCOPIC - Abnormal; Notable for the following:    Nitrite POSITIVE (*)    All other components within normal limits  URINE MICROSCOPIC-ADD ON - Abnormal; Notable for the following:    Squamous Epithelial / LPF FEW (*)    Bacteria, UA MANY (*)    All other components within normal limits  URINE CULTURE  POC URINE PREG, ED    Imaging Review Dg Abd Acute W/chest  05/23/2014   CLINICAL DATA:  Two day history of bilateral lower quadrant abdominal pain associated with constipation.  EXAM: DG ABDOMEN ACUTE W/ 1V CHEST  COMPARISON:  CT abdomen and pelvis 11/02/2006. CTA chest 11/05/2006. Multiple prior chest x-rays.  FINDINGS: Bowel gas pattern unremarkable without evidence of obstruction or significant ileus. No evidence of free air or significant air-fluid levels on the erect image. Large stool burden in the colon. No visible opaque urinary tract calculi. Phleboliths low in the right side of the pelvis. Regional skeleton intact.  Cardiomediastinal silhouette unremarkable, unchanged. Lungs clear. Bronchovascular markings normal. Pulmonary vascularity normal. No visible pleural effusions. No pneumothorax.  IMPRESSION: 1. No acute abdominal abnormality.  Large colonic stool burden. 2.  No acute cardiopulmonary  disease.   Electronically Signed   By: Hulan Saashomas  Lawrence M.D.   On: 05/23/2014 11:32     EKG Interpretation None     Filed Vitals:   05/23/14 1007 05/23/14 1100 05/23/14 1200 05/23/14 1230  BP: 117/78 111/69 113/73 101/64  Pulse: 80 80 86 77  Temp:      TempSrc:      Resp:      Height:      Weight:      SpO2: 99% 97% 99% 99%    MDM   Meds given in ED:  Medications  acetaminophen (TYLENOL) tablet 500 mg (500 mg Oral Given 05/23/14 1225)    New Prescriptions   CEPHALEXIN (KEFLEX) 500 MG CAPSULE    Take 1  capsule (500 mg total) by mouth 4 (four) times daily.   POLYETHYLENE GLYCOL POWDER (GLYCOLAX/MIRALAX) POWDER    Take 17 g by mouth 2 (two) times daily. Until daily soft stools  OTC    Final diagnoses:  Abdominal pain  Constipation, unspecified constipation type  UTI (lower urinary tract infection)  Pain, dental  Facial swelling   This is a 34 y.o. female who is a smoker who presents the emergency department complaining of right-sided dental pain with facial swelling and mid abdominal pain since yesterday. Patient reports she started having some mild dental pain and facial swelling to the right side of her face 2 days ago. Patient also reports that she is constipated and has not had a bowel movement in 2 weeks. She reports she is passing gas. The patient denies any nausea, vomiting or diarrhea. The patient has not had any drainage from her mouth. The patient is afebrile and nontoxic appearing on exam. The patient has a mild amount of swelling to her right cheek with likely source of infection from a cracked tooth in her right upper jaw. There is no signs of abscess or fluctuance. Patient has some very mild generalized abdominal pain without focal tenderness. No peritoneal signs. Plain films of her abdomen indicated a large colonic stool burden. UTI indicates nitrite-positive urine. Urine pregnancy is negative. CBC indicates a white count of 11.0. CMP is unremarkable. Will prescribe patient Keflex to cover this possible dental infection as well as her UTI. Patient given several resources for dental providers and I encouraged dental follow-up as soon as possible. Patient prescribed MiraLAX for her constipation. The patient declined enema or suppository in the emergency department. I advised the patient to follow-up with their primary care provider this week. I advised the patient to return to the emergency department with new or worsening symptoms or new concerns. The patient verbalized understanding and  agreement with plan.   This patient was discussed with and evaluated by Dr. Radford PaxBeaton who agrees with assessment and plan.     Everlene FarrierWilliam Thorvald Orsino, PA-C 05/23/14 1248  Nelva Nayobert Beaton, MD 05/26/14 1017

## 2014-05-23 NOTE — ED Notes (Signed)
Pt reports lower abd pain. Denies n/v/d or vaginal symptoms. Also has right side dental pain and swelling.

## 2014-05-23 NOTE — ED Notes (Signed)
Pt took 500 mg of Tylenol and 200 mg of Ibprofen  this morning at 0700.

## 2014-05-26 LAB — URINE CULTURE

## 2014-05-27 ENCOUNTER — Telehealth (HOSPITAL_BASED_OUTPATIENT_CLINIC_OR_DEPARTMENT_OTHER): Payer: Self-pay | Admitting: Emergency Medicine

## 2014-05-27 NOTE — Telephone Encounter (Signed)
Post ED Visit - Positive Culture Follow-up  Culture report reviewed by antimicrobial stewardship pharmacist: []  Wes Dulaney, Pharm.D., BCPS []  Celedonio MiyamotoJeremy Frens, [address] BCPS []  Georgina PillionElizabeth Martin, Pharm.D., BCPS []  LocoMinh Pham, [place]arm[place]., BCPS, AAHIVP [x]  Estella HuskMichelle Turner, Pharm.D., BCPS, AAHIVP []  Elder CyphersLorie Poole, [address] BCPS  Positive urine culture Klebsiella Treated with cephalexin, organism sensitive to the same and no further patient follow-up is required at this time.  Berle MullMiller, [name] 05/27/2014, 12:55 PM

## 2015-05-11 ENCOUNTER — Encounter (HOSPITAL_COMMUNITY): Payer: Self-pay

## 2015-05-11 ENCOUNTER — Emergency Department (HOSPITAL_COMMUNITY)
Admission: EM | Admit: 2015-05-11 | Discharge: 2015-05-11 | Disposition: A | Discharge status: Home / Self Care | Payer: Self-pay | Attending: Emergency Medicine | Admitting: Emergency Medicine

## 2015-05-11 DIAGNOSIS — K1379 Other lesions of oral mucosa: Secondary | ICD-10-CM | POA: Insufficient documentation

## 2015-05-11 DIAGNOSIS — B019 Varicella without complication: Secondary | ICD-10-CM | POA: Insufficient documentation

## 2015-05-11 DIAGNOSIS — F17210 Nicotine dependence, cigarettes, uncomplicated: Secondary | ICD-10-CM | POA: Insufficient documentation

## 2015-05-11 DIAGNOSIS — R000 Tachycardia, unspecified: Secondary | ICD-10-CM | POA: Insufficient documentation

## 2015-05-11 DIAGNOSIS — Z79899 Other long term (current) drug therapy: Secondary | ICD-10-CM | POA: Insufficient documentation

## 2015-05-11 NOTE — ED Provider Notes (Addendum)
CSN: [id number]     Arrival date & time 05/11/15  0748 History   First MD Initiated Contact with Patient 05/11/15 [phone]     Chief Complaint  Patient presents with  . Rash    facial      (Consider location/radiation/quality/duration/timing/severity/associated sxs/prior Treatment) HPI Comments: Patient is a 35 year old female who is otherwise healthy presenting today with a three-day history of general malaise, myalgias and a rash that started 2 days ago. The rash is mildly painful and started on the head and face but is also now involving the abdomen and extremities. Minimal itching and no new contacts.  Patient denies nausea or vomiting. No urinary symptoms. She has had intermittent headaches but denies sore throat. 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  Patient is a 35 y.o. female presenting with rash. The history is provided by the patient.  Rash   History reviewed. No pertinent past medical history. History reviewed. No pertinent past surgical history. History reviewed. No pertinent family history. Social History  Substance Use Topics  . Smoking status: Current Every Day Smoker    Types: Cigarettes  . Smokeless tobacco: None  . Alcohol Use: No   OB History    No data available     Review of Systems  Skin: Positive for rash.  All other systems reviewed and are negative.     Allergies  Review of patient's allergies indicates no known allergies.  Home Medications   Prior to Admission medications   Medication Sig Start Date End Date Taking? Authorizing Provider  DM-Doxylamine-Acetaminophen (NYQUIL COLD & FLU PO) Take 15 mLs by mouth 2 (two) times daily.   Yes Historical Provider, MD  ibuprofen (ADVIL,MOTRIN) 200 MG tablet Take 800 mg by mouth every 6 (six) hours as needed for moderate pain.   Yes Historical Provider, MD  acetaminophen (TYLENOL) 325 MG tablet Take 650 mg by mouth every 6 (six) hours as needed.     Historical Provider, MD  cephALEXin (KEFLEX) 500 MG capsule Take 1 capsule (500 mg total) by mouth 4 (four) times daily. Patient not taking: Reported on 05/11/2015 05/23/14   Everlene FarrierWilliam Dansie, PA-C  ibuprofen (ADVIL,MOTRIN) 400 MG tablet Take 400 mg by mouth every 6 (six) hours as needed for mild pain or moderate pain.    Historical Provider, MD  polyethylene glycol powder (GLYCOLAX/MIRALAX) powder Take 17 g by mouth 2 (two) times daily. Until daily soft stools  OTC Patient not taking: Reported on 05/11/2015 05/23/14   Everlene FarrierWilliam Dansie, PA-C   BP 109/82 mmHg  Pulse 111  Temp(Src) 98.7 F (37.1 C) (Oral)  Resp 14  Ht 5\' 4"  (1.626 m)  Wt 185 lb (83.915 kg)  BMI 31.74 kg/m2  SpO2 99% Physical Exam  Constitutional: She is oriented to person, place, and time. She appears well-developed and well-nourished. No distress.  HENT:  Head: Normocephalic and atraumatic.  Right Ear: Tympanic membrane normal.  Left Ear: Tympanic membrane normal.  Mouth/Throat: Oropharynx is clear and moist. Oral lesions present.    Eyes: Conjunctivae and EOM are normal. Pupils are equal, round, and reactive to light.  Neck: Normal range of motion. Neck supple.  Cardiovascular: Regular rhythm and intact distal pulses.  Tachycardia present.   No murmur heard. Pulmonary/Chest: Effort normal and breath sounds normal. No respiratory distress. She has no wheezes. She has no rales.  Abdominal: Soft. She exhibits no distension. There is no tenderness. There is no rebound and no  guarding.  Musculoskeletal: Normal range of motion. She exhibits no edema or tenderness.  Neurological: She is alert and oriented to person, place, and time.  Skin: Skin is warm and dry. Rash noted. Rash is vesicular. No erythema.  Vesicular lesions at various stages from the head down to the feet. Most pronounced over the face and ears. However lesions also present on the upper and lower extremities. Minimal tenderness with no excoriation.  Psychiatric:  She has a normal mood and affect. Her behavior is normal.  Nursing note and vitals reviewed.   ED Course  Procedures (including critical care time) Labs Review Labs Reviewed - No data to display  Imaging Review No results found. I have personally reviewed and evaluated these images and lab results as part of my medical decision-making.   EKG Interpretation None      MDM   Final diagnoses:  Chicken pox    Pt with classic signs of chicken pox.  Well appearing.  No complicating features.    Gwyneth Sprout, MD 05/11/15 1610  Gwyneth Sprout, MD 05/11/15 [phone]

## 2015-05-11 NOTE — ED Notes (Signed)
Pt. C/o nausea starting Monday and new onset of rash covering her face, neck, and upper extremities. Pt. Denies allergies. Pt. sts the spots rarely itch, but are somewhat painful. Pt. Aox4.

## 2015-05-11 NOTE — ED Notes (Signed)
Brought patient back to room with visitor in tow; patient given a gown to place on and a sheet to cover with; Denny PeonErin, RN aware

## 2015-05-11 NOTE — ED Notes (Signed)
EDP at bedside  

## 2015-05-11 NOTE — Discharge Instructions (Signed)
Chickenpox, Adult °Chickenpox is an illness caused by a virus. Chickenpox can be very serious for adults. Adults have a higher risk for complications than children. Complications of chickenpox include: °· Pneumonia. °· Skin infection. °· Bone infection (osteomyelitis). °· Joint infection (septic arthritis). °· Brain infection (encephalitis). °· Toxic shock syndrome. °· Bleeding problems. °· Problems with balance and muscle control (cerebellar ataxia). °· Having a baby with a birth defect, if you are pregnant. °· Death. °If you have had chickenpox once, you probably will not get it again. If you are exposed to chickenpox and have never had the illness or the vaccine, you may develop the illness within 21 days. °CAUSES  °Chickenpox is caused by a virus. This virus spreads easily from one person to another through the tiny droplets released into the air when an infected person coughs or sneezes. It also spreads through the fluid produced by the chickenpox rash. °RISK FACTORS °People who have never had chickenpox and have never been vaccinated are at risk. You may also be at greater risk for chickenpox if: °· You are a health care worker. °· You are a college student. °· You are in the military. °· You live in an institution. °· You are a teacher. °· You have poor resistance to infection. °SIGNS AND SYMPTOMS  °· Rash. The rash is made up of itchy blisters that heal over with a crusty scab. The rash may appear a day or two after other symptoms. °· Body aches and pain. °· Headache. °· Irritability. °· Tiredness. °· Fever. °· Sore throat. °DIAGNOSIS  °Your health care provider will make a diagnosis based on your symptoms. You may have a blood test to confirm the diagnosis. °TREATMENT  °Treatments may include: °· Taking medicine to shorten how long the illness lasts and to reduce its severity. °· Applying calamine lotion to relieve itchiness. °· Using baking soda or oatmeal baths to soothe itchy skin. °Ask your health care  provider if you may use a type of over-the-counter medicine called an antihistamine to reduce itching. °HOME CARE INSTRUCTIONS  °· Take medicines only as directed by your health care provider. °· Try taking a bath in lukewarm water every few hours. Add several tablespoons of baking soda or oatmeal to the water to make the bath more soothing. Do not bathe in hot water. °· Apply an ice pack or a cold washcloth to the rash to relieve itching. °· Wash your hands often. This lowers your chance of getting a bacterial skin infection and passing the virus to others. °· Do not eat or drink spicy, salty, or acidic things if you have blisters in your mouth. Soft, bland, and cold foods and beverages will be easiest to swallow. °· Do not be around: °¨ People who have not had chickenpox and have not been vaccinated against it. °¨ People with a weakened immune system, including those with HIV, AIDS, or cancer, those who have had a transplant or are on chemotherapy, and those who take medicines that weaken the immune system. °¨ Pregnant women. °· If a person is exposed to your chickenpox and has not had it before or been vaccinated against it, has a weakened immune system, or is pregnant, he or she should call a health care provider. °SEEK IMMEDIATE MEDICAL CARE IF:  °· You have trouble breathing. °· You have a severe headache. °· You have a stiff neck. °· You have severe joint pain or stiffness. °· You feel disoriented or confused. °· You have trouble   walking or keeping your balance. °· You have a fever and your symptoms suddenly get worse. °· The area around one of the blisters leaks pus or becomes very red, hot to the touch, or painful. °MAKE SURE YOU: °· Understand these instructions. °· Will watch your condition. °· Will get help right away if you are not doing well or get worse. °  °This information is not intended to replace advice given to you by your health care provider. Make sure you discuss any questions you have with  your health care provider. °  °Document Released: 10/25/2007 Document Revised: 02/05/2014 Document Reviewed: 12/09/2012 °[employer]vier Interactive Patient Education ©2016 [employer] ° °

## 2018-09-07 ENCOUNTER — Encounter (HOSPITAL_COMMUNITY): Payer: Self-pay

## 2018-09-07 ENCOUNTER — Inpatient Hospital Stay (HOSPITAL_COMMUNITY)
Admission: EM | Admit: 2018-09-07 | Discharge: 2018-09-09 | DRG: 759 | Disposition: A | Discharge status: Home / Self Care | Payer: PRIVATE HEALTH INSURANCE | Attending: Obstetrics & Gynecology | Admitting: Obstetrics & Gynecology

## 2018-09-07 ENCOUNTER — Emergency Department (HOSPITAL_COMMUNITY): Payer: PRIVATE HEALTH INSURANCE

## 2018-09-07 ENCOUNTER — Other Ambulatory Visit: Payer: Self-pay

## 2018-09-07 DIAGNOSIS — F17210 Nicotine dependence, cigarettes, uncomplicated: Secondary | ICD-10-CM | POA: Diagnosis not present

## 2018-09-07 DIAGNOSIS — R102 Pelvic and perineal pain: Secondary | ICD-10-CM | POA: Diagnosis not present

## 2018-09-07 DIAGNOSIS — N7093 Salpingitis and oophoritis, unspecified: Secondary | ICD-10-CM | POA: Diagnosis not present

## 2018-09-07 DIAGNOSIS — N7091 Salpingitis, unspecified: Principal | ICD-10-CM | POA: Diagnosis present

## 2018-09-07 DIAGNOSIS — Z20828 Contact with and (suspected) exposure to other viral communicable diseases: Secondary | ICD-10-CM | POA: Diagnosis not present

## 2018-09-07 DIAGNOSIS — Z79899 Other long term (current) drug therapy: Secondary | ICD-10-CM | POA: Diagnosis not present

## 2018-09-07 DIAGNOSIS — Z791 Long term (current) use of non-steroidal anti-inflammatories (NSAID): Secondary | ICD-10-CM

## 2018-09-07 DIAGNOSIS — R1031 Right lower quadrant pain: Secondary | ICD-10-CM

## 2018-09-07 LAB — CBC
HCT: 42.3 % (ref 36.0–46.0)
Hemoglobin: 13.6 g/dL (ref 12.0–15.0)
MCH: 29.2 pg (ref 26.0–34.0)
MCHC: 32.2 g/dL (ref 30.0–36.0)
MCV: 91 fL (ref 80.0–100.0)
Platelets: 342 10*3/uL (ref 150–400)
RBC: 4.65 MIL/uL (ref 3.87–5.11)
RDW: 13.6 % (ref 11.5–15.5)
WBC: 18.5 10*3/uL — ABNORMAL HIGH (ref 4.0–10.5)
nRBC: 0 % (ref 0.0–0.2)

## 2018-09-07 LAB — URINALYSIS, ROUTINE W REFLEX MICROSCOPIC
Bilirubin Urine: NEGATIVE
Glucose, UA: NEGATIVE mg/dL
Hgb urine dipstick: NEGATIVE
Ketones, ur: 20 mg/dL — AB
Leukocytes,Ua: NEGATIVE
Nitrite: NEGATIVE
Protein, ur: NEGATIVE mg/dL
Specific Gravity, Urine: 1.01 (ref 1.005–1.030)
pH: 6 (ref 5.0–8.0)

## 2018-09-07 LAB — COMPREHENSIVE METABOLIC PANEL
ALT: 14 U/L (ref 0–44)
AST: 19 U/L (ref 15–41)
Albumin: 3.7 g/dL (ref 3.5–5.0)
Alkaline Phosphatase: 66 U/L (ref 38–126)
Anion gap: 10 (ref 5–15)
BUN: 10 mg/dL (ref 6–20)
CO2: 20 mmol/L — ABNORMAL LOW (ref 22–32)
Calcium: 8.8 mg/dL — ABNORMAL LOW (ref 8.9–10.3)
Chloride: 107 mmol/L (ref 98–111)
Creatinine, Ser: 0.82 mg/dL (ref 0.44–1.00)
GFR calc Af Amer: >60 mL/min (ref >60)
GFR calc non Af Amer: >60 mL/min (ref >60)
Glucose, Bld: 97 mg/dL (ref 70–99)
Potassium: 3.6 mmol/L (ref 3.5–5.1)
Sodium: 137 mmol/L (ref 135–145)
Total Bilirubin: 0.9 mg/dL (ref 0.3–1.2)
Total Protein: 7.3 g/dL (ref 6.5–8.1)

## 2018-09-07 LAB — SARS CORONAVIRUS 2 BY RT PCR (HOSPITAL ORDER, PERFORMED IN ~~LOC~~ HOSPITAL LAB): SARS Coronavirus 2: NEGATIVE

## 2018-09-07 LAB — I-STAT BETA HCG BLOOD, ED (MC, WL, AP ONLY): I-stat hCG, quantitative: <5 m[IU]/mL (ref <5)

## 2018-09-07 LAB — LIPASE, BLOOD: Lipase: 22 U/L (ref 11–51)

## 2018-09-07 MED ORDER — PIPERACILLIN-TAZOBACTAM 3.375 G IVPB 30 MIN: 3.3750 g | Freq: Three times a day (TID) | INTRAVENOUS | Status: DC

## 2018-09-07 MED ORDER — ALUM & MAG HYDROXIDE-SIMETH 200-200-20 MG/5ML PO SUSP: 30.0000 mL | ORAL | Status: DC | PRN

## 2018-09-07 MED ORDER — ENOXAPARIN SODIUM 40 MG/0.4ML ~~LOC~~ SOLN
40.0000 mg | SUBCUTANEOUS | Status: DC
  Administered 2018-09-07 – 2018-09-08 (×2): 40 mg via SUBCUTANEOUS
  Filled 2018-09-07 (×2): qty 0.4

## 2018-09-07 MED ORDER — IOHEXOL 300 MG/ML  SOLN
100.0000 mL | Freq: Once | INTRAMUSCULAR | Status: AC | PRN
  Administered 2018-09-07: 100 mL via INTRAVENOUS

## 2018-09-07 MED ORDER — SODIUM CHLORIDE (PF) 0.9 % IJ SOLN
INTRAMUSCULAR | Status: AC
  Filled 2018-09-07: qty 50

## 2018-09-07 MED ORDER — SODIUM CHLORIDE 0.9 % IV BOLUS
1000.0000 mL | Freq: Once | INTRAVENOUS | Status: AC
  Administered 2018-09-07: 1000 mL via INTRAVENOUS

## 2018-09-07 MED ORDER — IBUPROFEN 800 MG PO TABS: 800.0000 mg | ORAL_TABLET | Freq: Three times a day (TID) | ORAL | Status: DC | PRN

## 2018-09-07 MED ORDER — ONDANSETRON HCL 4 MG/2ML IJ SOLN
4.0000 mg | Freq: Once | INTRAMUSCULAR | Status: AC
  Administered 2018-09-07: 4 mg via INTRAVENOUS
  Filled 2018-09-07: qty 2

## 2018-09-07 MED ORDER — ONDANSETRON HCL 4 MG/2ML IJ SOLN: 4.0000 mg | Freq: Four times a day (QID) | INTRAMUSCULAR | Status: DC | PRN

## 2018-09-07 MED ORDER — LACTATED RINGERS IV SOLN
INTRAVENOUS | Status: DC
  Administered 2018-09-07 – 2018-09-09 (×4): via INTRAVENOUS

## 2018-09-07 MED ORDER — HYDROCODONE-ACETAMINOPHEN 5-325 MG PO TABS
1.0000 | ORAL_TABLET | ORAL | Status: DC | PRN
  Administered 2018-09-08 (×2): 2 via ORAL
  Filled 2018-09-07 (×2): qty 2

## 2018-09-07 MED ORDER — MORPHINE SULFATE (PF) 4 MG/ML IV SOLN
4.0000 mg | Freq: Once | INTRAVENOUS | Status: AC
  Administered 2018-09-07: 4 mg via INTRAVENOUS
  Filled 2018-09-07: qty 1

## 2018-09-07 MED ORDER — ONDANSETRON HCL 4 MG PO TABS: 4.0000 mg | ORAL_TABLET | Freq: Four times a day (QID) | ORAL | Status: DC | PRN

## 2018-09-07 MED ORDER — SODIUM CHLORIDE 0.9% FLUSH: 3.0000 mL | Freq: Once | INTRAVENOUS | Status: DC

## 2018-09-07 MED ORDER — PIPERACILLIN-TAZOBACTAM 3.375 G IVPB 30 MIN
3.3750 g | Freq: Once | INTRAVENOUS | Status: AC
  Administered 2018-09-07: 3.375 g via INTRAVENOUS
  Filled 2018-09-07: qty 50

## 2018-09-07 MED ORDER — HYDROMORPHONE HCL 1 MG/ML IJ SOLN
1.0000 mg | INTRAMUSCULAR | Status: DC | PRN
  Administered 2018-09-07 (×2): 1 mg via INTRAVENOUS
  Filled 2018-09-07 (×2): qty 1

## 2018-09-07 MED ORDER — PIPERACILLIN-TAZOBACTAM 3.375 G IVPB
3.3750 g | Freq: Three times a day (TID) | INTRAVENOUS | Status: DC
  Administered 2018-09-08 – 2018-09-09 (×5): 3.375 g via INTRAVENOUS
  Filled 2018-09-07 (×5): qty 50

## 2018-09-07 MED ORDER — MORPHINE SULFATE (PF) 4 MG/ML IV SOLN
4.0000 mg | Freq: Once | INTRAVENOUS | Status: AC
  Administered 2018-09-07: 13:00:00 4 mg via INTRAVENOUS
  Filled 2018-09-07: qty 1

## 2018-09-07 MED ORDER — ACETAMINOPHEN 500 MG PO TABS
1000.0000 mg | ORAL_TABLET | Freq: Four times a day (QID) | ORAL | Status: DC | PRN
  Administered 2018-09-07: 1000 mg via ORAL
  Filled 2018-09-07: qty 2

## 2018-09-07 MED ORDER — ZOLPIDEM TARTRATE 5 MG PO TABS: 5.0000 mg | ORAL_TABLET | Freq: Every evening | ORAL | Status: DC | PRN

## 2018-09-07 NOTE — ED Provider Notes (Signed)
Emergency Department Provider Note   I have reviewed the triage vital signs and the nursing notes.   HISTORY  Chief Complaint Abdominal Pain and Nausea   HPI Megan Ponce is a 38 y.o. female with no significant past medical or surgical history presents to the emergency department with right lower quadrant abdominal pain.  Symptoms began 3 days ago but worsened overnight.  With pain worsening she has had some associated nausea and feeling of lightheadedness.  She had bowel movement yesterday which was nonbloody.  No associated diarrhea.  No vomiting.  Pain is worse in the right lower quadrant with some radiation across the lower abdomen.  No UTI symptoms.  No vaginal bleeding or discharge.  Last menstrual period was July 10th.   History reviewed. No pertinent past medical history.  Patient Active Problem List   Diagnosis Date Noted  . Pyosalpinx 09/07/2018    History reviewed. No pertinent surgical history.  Allergies Patient has no known allergies.  No family history on file.  Social History Social History   Tobacco Use  . Smoking status: Current Every Day Smoker    Types: Cigarettes  . Smokeless tobacco: Never Used  Substance Use Topics  . Alcohol use: No  . Drug use: No    Review of Systems  Constitutional: No fever/chills Eyes: No visual changes. ENT: No sore throat. Cardiovascular: Denies chest pain. Respiratory: Denies shortness of breath. Gastrointestinal: Positive RLQ abdominal pain. Positive nausea, no vomiting.  No diarrhea.  No constipation. Genitourinary: Negative for dysuria.  Musculoskeletal: Negative for back pain. Skin: Negative for rash. Neurological: Negative for headaches, focal weakness or numbness.  10-point ROS otherwise negative.  ____________________________________________   PHYSICAL EXAM:  VITAL SIGNS: ED Triage Vitals  Enc Vitals Group     BP 09/07/18 0753 130/89     Pulse Rate 09/07/18 0753 (!) 122     Resp 09/07/18  0753 19     Temp 09/07/18 0753 98.5 F (36.9 C)     Temp Source 09/07/18 0753 Oral     SpO2 09/07/18 0753 100 %   Constitutional: Alert and oriented. Well appearing and in no acute distress. Eyes: Conjunctivae are normal.  Head: Atraumatic. Nose: No congestion/rhinnorhea. Mouth/Throat: Mucous membranes are moist. Neck: No stridor.   Cardiovascular: Tachycardia. Good peripheral circulation. Grossly normal heart sounds.   Respiratory: Normal respiratory effort.  No retractions. Lungs CTAB. Gastrointestinal: Soft with focal tenderness in the RLQ. Positive Rosving's sign. Mild RUQ tenderness. Negative Murphy's sign. No distention.  Musculoskeletal: No lower extremity tenderness nor edema. No gross deformities of extremities. Neurologic:  Normal speech and language. Skin:  Skin is warm, dry and intact. No rash noted.  ____________________________________________   LABS (all labs ordered are listed, but only abnormal results are displayed)  Labs Reviewed  COMPREHENSIVE METABOLIC PANEL - Abnormal; Notable for the following components:      Result Value   CO2 20 (*)    Calcium 8.8 (*)    All other components within normal limits  CBC - Abnormal; Notable for the following components:   WBC 18.5 (*)    All other components within normal limits  URINALYSIS, ROUTINE W REFLEX MICROSCOPIC - Abnormal; Notable for the following components:   Ketones, ur 20 (*)    All other components within normal limits  SARS CORONAVIRUS 2 (HOSPITAL ORDER, PERFORMED IN [facility])  LIPASE, BLOOD  I-STAT BETA HCG BLOOD, ED ([facility], [facility], [facility] ONLY)  GC/CHLAMYDIA PROBE AMP ([place]) NOT  AT [facility]   ____________________________________________  RADIOLOGY  [place] Transvaginal Non-ob  Result Date: 09/07/2018 CLINICAL DATA:  Pelvic pain for 4 days. EXAM: TRANSABDOMINAL AND TRANSVAGINAL ULTRASOUND OF PELVIS DOPPLER ULTRASOUND OF OVARIES TECHNIQUE: Both transabdominal and transvaginal ultrasound  examinations of the pelvis were performed. Transabdominal technique was performed for global imaging of the pelvis including uterus, ovaries, adnexal regions, and pelvic cul-de-sac. It was necessary to proceed with endovaginal exam following the transabdominal exam to visualize the uterus, ovaries, and adnexa. Color and duplex Doppler ultrasound was utilized to evaluate blood flow to the ovaries. COMPARISON:  11/03/2006. Today's abdominopelvic CT, dictated separately. FINDINGS: Uterus Measurements: 6.7 x 3.2 x 4.3 cm = volume: 50 mL. No fibroids or other mass visualized. Endometrium Thickness: Normal, 8 mm.  No focal abnormality visualized. Right ovary Measurements: 2.8 x 2.6 x 2.6 cm = volume: 10.2 mL. Normal appearance. Left ovary Measurements: 5.4 x 3.1 x 3.3 cm = volume: 28.8 mL. Normal appearance. As on CT, both fallopian tubes are dilated. Heterogeneity and low-level echoes within, most apparent on the right. Pulsed Doppler evaluation of both ovaries demonstrates normal low-resistance arterial and venous waveforms. Other findings No abnormal free fluid. IMPRESSION: 1. Bilateral hydrosalpinx with complexity within. Findings are highly suspicious for pyosalpinx. 2. No evidence of ovarian or adnexal torsion. Electronically Signed   By: Jeronimo GreavesKyle  Talbot M.D.   On: 09/07/2018 14:28   [place] Pelvis Complete  Result Date: 09/07/2018 CLINICAL DATA:  Pelvic pain for 4 days. EXAM: TRANSABDOMINAL AND TRANSVAGINAL ULTRASOUND OF PELVIS DOPPLER ULTRASOUND OF OVARIES TECHNIQUE: Both transabdominal and transvaginal ultrasound examinations of the pelvis were performed. Transabdominal technique was performed for global imaging of the pelvis including uterus, ovaries, adnexal regions, and pelvic cul-de-sac. It was necessary to proceed with endovaginal exam following the transabdominal exam to visualize the uterus, ovaries, and adnexa. Color and duplex Doppler ultrasound was utilized to evaluate blood flow to the ovaries. COMPARISON:   11/03/2006. Today's abdominopelvic CT, dictated separately. FINDINGS: Uterus Measurements: 6.7 x 3.2 x 4.3 cm = volume: 50 mL. No fibroids or other mass visualized. Endometrium Thickness: Normal, 8 mm.  No focal abnormality visualized. Right ovary Measurements: 2.8 x 2.6 x 2.6 cm = volume: 10.2 mL. Normal appearance. Left ovary Measurements: 5.4 x 3.1 x 3.3 cm = volume: 28.8 mL. Normal appearance. As on CT, both fallopian tubes are dilated. Heterogeneity and low-level echoes within, most apparent on the right. Pulsed Doppler evaluation of both ovaries demonstrates normal low-resistance arterial and venous waveforms. Other findings No abnormal free fluid. IMPRESSION: 1. Bilateral hydrosalpinx with complexity within. Findings are highly suspicious for pyosalpinx. 2. No evidence of ovarian or adnexal torsion. Electronically Signed   By: Jeronimo GreavesKyle  Talbot M.D.   On: 09/07/2018 14:28   Ct Abdomen Pelvis W Contrast  Result Date: 09/07/2018 CLINICAL DATA:  38 year old female with history of severe right lower quadrant abdominal pain for the past 4 days. Suspected appendicitis. EXAM: CT ABDOMEN AND PELVIS WITH CONTRAST TECHNIQUE: Multidetector CT imaging of the abdomen and pelvis was performed using the standard protocol following bolus administration of intravenous contrast. CONTRAST:  100mL OMNIPAQUE IOHEXOL 300 MG/ML  SOLN COMPARISON:  No priors. FINDINGS: Lower chest: Patchy areas of ground-glass attenuation and septal thickening in the periphery of the lung bases bilaterally. Hepatobiliary: No suspicious cystic or solid hepatic lesions. No intra or extrahepatic biliary ductal dilatation. Gallbladder is normal in appearance. Pancreas: No pancreatic mass. No pancreatic ductal dilatation. No pancreatic or peripancreatic fluid collections or inflammatory changes. Spleen: Unremarkable. Adrenals/Urinary  Tract: Bilateral kidneys and bilateral adrenal glands are normal in appearance. No hydroureteronephrosis. Urinary bladder is  normal in appearance. Stomach/Bowel: Normal appearance of the stomach. No pathologic dilatation of small bowel or colon. Normal appendix. Vascular/Lymphatic: Aortic atherosclerosis, without evidence of aneurysm or dissection in the abdominal or pelvic vasculature. No pathologically enlarged lymph nodes in the abdomen or pelvis. Reproductive: Uterus is unremarkable in appearance. Fallopian tubes appear dilated bilaterally (right greater than left) measuring up to 3.3 cm in diameter on the right. Avid enhancement of the epithelial lining of the fallopian tubes bilaterally. Ovaries are unremarkable in appearance. Other: Trace amount of free fluid in the cul-de-sac, presumably physiologic in this young female patient. No pneumoperitoneum. Musculoskeletal: There are no aggressive appearing lytic or blastic lesions noted in the visualized portions of the skeleton. IMPRESSION: 1. Marked dilatation of the fallopian tubes bilaterally (right greater than left). This may simply represent bilateral hydrosalpinx, however, clinical correlation for signs and symptoms of tube ovarian abscess is suggested. 2. Aortic atherosclerosis. Electronically Signed   By: Trudie Reedaniel  Entrikin M.D.   On: 09/07/2018 12:08   [place] Art/ven Flow Abd Pelv Doppler  Result Date: 09/07/2018 CLINICAL DATA:  Pelvic pain for 4 days. EXAM: TRANSABDOMINAL AND TRANSVAGINAL ULTRASOUND OF PELVIS DOPPLER ULTRASOUND OF OVARIES TECHNIQUE: Both transabdominal and transvaginal ultrasound examinations of the pelvis were performed. Transabdominal technique was performed for global imaging of the pelvis including uterus, ovaries, adnexal regions, and pelvic cul-de-sac. It was necessary to proceed with endovaginal exam following the transabdominal exam to visualize the uterus, ovaries, and adnexa. Color and duplex Doppler ultrasound was utilized to evaluate blood flow to the ovaries. COMPARISON:  11/03/2006. Today's abdominopelvic CT, dictated separately. FINDINGS: Uterus  Measurements: 6.7 x 3.2 x 4.3 cm = volume: 50 mL. No fibroids or other mass visualized. Endometrium Thickness: Normal, 8 mm.  No focal abnormality visualized. Right ovary Measurements: 2.8 x 2.6 x 2.6 cm = volume: 10.2 mL. Normal appearance. Left ovary Measurements: 5.4 x 3.1 x 3.3 cm = volume: 28.8 mL. Normal appearance. As on CT, both fallopian tubes are dilated. Heterogeneity and low-level echoes within, most apparent on the right. Pulsed Doppler evaluation of both ovaries demonstrates normal low-resistance arterial and venous waveforms. Other findings No abnormal free fluid. IMPRESSION: 1. Bilateral hydrosalpinx with complexity within. Findings are highly suspicious for pyosalpinx. 2. No evidence of ovarian or adnexal torsion. Electronically Signed   By: Jeronimo GreavesKyle  Talbot M.D.   On: 09/07/2018 14:28    ____________________________________________   PROCEDURES  Procedure(s) performed:   Procedures  None ____________________________________________   INITIAL IMPRESSION / ASSESSMENT AND PLAN / ED COURSE  Pertinent labs & imaging results that were available during my care of the patient were reviewed by me and considered in my medical decision making (see chart for details).   Patient presents to the emergency department for evaluation of abdominal pain for the past 4 days.  Worsening in the last 12 hours with mostly tenderness in the right lower quadrant.  No GU symptoms.  Differential includes acute appendicitis.  Lower suspicion for torsion or ectopic pregnancy.  Will obtain screening lab work, IV fluids, CT imaging, and reassess.   02:47 PM  CT imaging showing bilateral fallopian tube dilation worse on the right.  Transvaginal ultrasound followed with concern for pyosalpinx.  Lab work reviewed which shows leukocytosis.  Discussed the case with OB/GYN on-call Dr. Alysia PennaErvin who request that we start Zosyn and admit her to [facility].  I have placed temporary  admission orders at his request.    Discussed patient's case with OB, Dr. Rip Harbour to request admission. Patient and family (if present) updated with plan. Care transferred to [facility] service.  I reviewed all nursing notes, vitals, pertinent old records, EKGs, labs, imaging (as available).  ____________________________________________  FINAL CLINICAL IMPRESSION(S) / ED DIAGNOSES  Final diagnoses:  Pyosalpinx  Right lower quadrant abdominal pain     MEDICATIONS GIVEN DURING THIS VISIT:  Medications  sodium chloride (PF) 0.9 % injection (has no administration in time range)  enoxaparin (LOVENOX) injection 40 mg (has no administration in time range)  lactated ringers infusion ( Intravenous New Bag/Given 09/07/18 1756)  ibuprofen (ADVIL) tablet 800 mg (has no administration in time range)  HYDROcodone-acetaminophen (NORCO/VICODIN) 5-325 MG per tablet 1-2 tablet (has no administration in time range)  HYDROmorphone (DILAUDID) injection 1 mg (1 mg Intravenous Given 09/07/18 1758)  zolpidem (AMBIEN) tablet 5 mg (has no administration in time range)  alum & mag hydroxide-simeth (MAALOX/MYLANTA) 200-200-20 MG/5ML suspension 30 mL (has no administration in time range)  ondansetron (ZOFRAN) tablet 4 mg (has no administration in time range)    Or  ondansetron (ZOFRAN) injection 4 mg (has no administration in time range)  acetaminophen (TYLENOL) tablet 1,000 mg (has no administration in time range)  piperacillin-tazobactam (ZOSYN) IVPB 3.375 g (has no administration in time range)  ondansetron (ZOFRAN) injection 4 mg (4 mg Intravenous Given 09/07/18 0843)  morphine 4 MG/ML injection 4 mg (4 mg Intravenous Given 09/07/18 0843)  sodium chloride 0.9 % bolus 1,000 mL (0 mLs Intravenous Stopped 09/07/18 1225)  iohexol (OMNIPAQUE) 300 MG/ML solution 100 mL (100 mLs Intravenous Contrast Given 09/07/18 1024)  morphine 4 MG/ML injection 4 mg (4 mg Intravenous Given 09/07/18 1245)  piperacillin-tazobactam (ZOSYN) IVPB 3.375 g (3.375 g Intravenous New Bag/Given  09/07/18 1625)    Note:  This document was prepared using Dragon voice recognition software and may include unintentional dictation errors.  Nanda Quinton, MD Emergency Medicine    Shanteria Laye, [facility], MD 09/07/18 Lurline Hare

## 2018-09-07 NOTE — H&P (Signed)
Megan Ponce is an 38 y.o. female G0 who presented to [facility] ER with 4 day H/O abd/pelvic pain. Subjective fever and chills.  W/U in the ER was consistent with bilateral pyosalpinx. [facility] for further eval and treatment. LMP 08/08/18, sexual active without contraception. Last IC Friday, some pain. H/O Gonorrhea in the past. Last pap several yrs ago, reports as negative.  History reviewed. No pertinent past medical history.  History reviewed. No pertinent surgical history.  No family history on file.  Social History:  reports that she has been smoking cigarettes. She has never used smokeless tobacco. She reports that she does not drink alcohol or use drugs.  Allergies: No Known Allergies  Medications Prior to Admission  Medication Sig Dispense Refill Last Dose  . diphenhydrAMINE (BENADRYL) 25 MG tablet Take 25 mg by mouth at bedtime as needed for sleep.   Past Week at Unknown time  . ibuprofen (ADVIL,MOTRIN) 200 MG tablet Take 800 mg by mouth every 6 (six) hours as needed for moderate pain.   09/06/2018 at Unknown time  . cephALEXin (KEFLEX) 500 MG capsule Take 1 capsule (500 mg total) by mouth 4 (four) times daily. (Patient not taking: Reported on 05/11/2015) 40 capsule 0 Not Taking at Unknown time  . polyethylene glycol powder (GLYCOLAX/MIRALAX) powder Take 17 g by mouth 2 (two) times daily. Until daily soft stools  OTC (Patient not taking: Reported on 05/11/2015) 225 g 0 Not Taking at Unknown time    Review of Systems  Constitutional: Positive for chills and fever.  Respiratory: Negative.   Cardiovascular: Negative.   Gastrointestinal: Positive for abdominal pain.    Blood pressure 133/81, pulse (!) 126, temperature (!) 100.9 F (38.3 C), temperature source Oral, resp. rate 19, height 5\' 4"  (1.626 m), weight 99 kg, last menstrual period 08/08/2018, SpO2 100 %. Physical Exam  Constitutional: She appears well-developed and well-nourished.  Ill appearing but NAD   Cardiovascular: Normal rate and regular rhythm.  Respiratory: Effort normal and breath sounds normal.  GI: Soft. Bowel sounds are normal.  Diffuse lower abd/pelvic tenderness  Genitourinary:    Genitourinary Comments: deferred     Results for orders placed or performed during the hospital encounter of 09/07/18 (from the past 24 hour(s))  Urinalysis, Routine w reflex microscopic     Status: Abnormal   Collection Time: 09/07/18  7:57 AM  Result Value Ref Range   Color, Urine YELLOW YELLOW   APPearance CLEAR CLEAR   Specific Gravity, Urine 1.010 1.005 - 1.030   pH 6.0 5.0 - 8.0   Glucose, UA NEGATIVE NEGATIVE mg/dL   Hgb urine dipstick NEGATIVE NEGATIVE   Bilirubin Urine NEGATIVE NEGATIVE   Ketones, ur 20 (A) NEGATIVE mg/dL   Protein, ur NEGATIVE NEGATIVE mg/dL   Nitrite NEGATIVE NEGATIVE   Leukocytes,Ua NEGATIVE NEGATIVE  Lipase, blood     Status: None   Collection Time: 09/07/18  8:47 AM  Result Value Ref Range   Lipase 22 11 - 51 U/L  Comprehensive metabolic panel     Status: Abnormal   Collection Time: 09/07/18  8:47 AM  Result Value Ref Range   Sodium 137 135 - 145 mmol/L   Potassium 3.6 3.5 - 5.1 mmol/L   Chloride 107 98 - 111 mmol/L   CO2 20 (L) 22 - 32 mmol/L   Glucose, Bld 97 70 - 99 mg/dL   BUN 10 6 - 20 mg/dL   Creatinine, Ser 4.690.82 0.44 - 1.00 mg/dL   Calcium 8.8 (  L) 8.9 - 10.3 mg/dL   Total Protein 7.3 6.5 - 8.1 g/dL   Albumin 3.7 3.5 - 5.0 g/dL   AST 19 15 - 41 U/L   ALT 14 0 - 44 U/L   Alkaline Phosphatase 66 38 - 126 U/L   Total Bilirubin 0.9 0.3 - 1.2 mg/dL   GFR calc non Af Amer >60 >60 mL/min   GFR calc Af Amer >60 >60 mL/min   Anion gap 10 5 - 15  CBC     Status: Abnormal   Collection Time: 09/07/18  8:47 AM  Result Value Ref Range   WBC 18.5 (H) 4.0 - 10.5 K/uL   RBC 4.65 3.87 - 5.11 MIL/uL   Hemoglobin 13.6 12.0 - 15.0 g/dL   HCT 16.142.3 09.636.0 - 04.546.0 %   MCV 91.0 80.0 - 100.0 fL   MCH 29.2 26.0 - 34.0 pg   MCHC 32.2 30.0 - 36.0 g/dL   RDW 40.913.6  81.111.5 - 91.415.5 %   Platelets 342 150 - 400 K/uL   nRBC 0.0 0.0 - 0.2 %  I-Stat beta hCG blood, ED     Status: None   Collection Time: 09/07/18  8:54 AM  Result Value Ref Range   I-stat hCG, quantitative <5.0 <5 mIU/mL   Comment 3            [place] Transvaginal Non-ob  Result Date: 09/07/2018 CLINICAL DATA:  Pelvic pain for 4 days. EXAM: TRANSABDOMINAL AND TRANSVAGINAL ULTRASOUND OF PELVIS DOPPLER ULTRASOUND OF OVARIES TECHNIQUE: Both transabdominal and transvaginal ultrasound examinations of the pelvis were performed. Transabdominal technique was performed for global imaging of the pelvis including uterus, ovaries, adnexal regions, and pelvic cul-de-sac. It was necessary to proceed with endovaginal exam following the transabdominal exam to visualize the uterus, ovaries, and adnexa. Color and duplex Doppler ultrasound was utilized to evaluate blood flow to the ovaries. COMPARISON:  11/03/2006. Today's abdominopelvic CT, dictated separately. FINDINGS: Uterus Measurements: 6.7 x 3.2 x 4.3 cm = volume: 50 mL. No fibroids or other mass visualized. Endometrium Thickness: Normal, 8 mm.  No focal abnormality visualized. Right ovary Measurements: 2.8 x 2.6 x 2.6 cm = volume: 10.2 mL. Normal appearance. Left ovary Measurements: 5.4 x 3.1 x 3.3 cm = volume: 28.8 mL. Normal appearance. As on CT, both fallopian tubes are dilated. Heterogeneity and low-level echoes within, most apparent on the right. Pulsed Doppler evaluation of both ovaries demonstrates normal low-resistance arterial and venous waveforms. Other findings No abnormal free fluid. IMPRESSION: 1. Bilateral hydrosalpinx with complexity within. Findings are highly suspicious for pyosalpinx. 2. No evidence of ovarian or adnexal torsion. Electronically Signed   By: Jeronimo GreavesKyle  Talbot M.D.   On: 09/07/2018 14:28   [place] Pelvis Complete  Result Date: 09/07/2018 CLINICAL DATA:  Pelvic pain for 4 days. EXAM: TRANSABDOMINAL AND TRANSVAGINAL ULTRASOUND OF PELVIS DOPPLER  ULTRASOUND OF OVARIES TECHNIQUE: Both transabdominal and transvaginal ultrasound examinations of the pelvis were performed. Transabdominal technique was performed for global imaging of the pelvis including uterus, ovaries, adnexal regions, and pelvic cul-de-sac. It was necessary to proceed with endovaginal exam following the transabdominal exam to visualize the uterus, ovaries, and adnexa. Color and duplex Doppler ultrasound was utilized to evaluate blood flow to the ovaries. COMPARISON:  11/03/2006. Today's abdominopelvic CT, dictated separately. FINDINGS: Uterus Measurements: 6.7 x 3.2 x 4.3 cm = volume: 50 mL. No fibroids or other mass visualized. Endometrium Thickness: Normal, 8 mm.  No focal abnormality visualized. Right ovary Measurements: 2.8 x 2.6 x 2.6 cm =  volume: 10.2 mL. Normal appearance. Left ovary Measurements: 5.4 x 3.1 x 3.3 cm = volume: 28.8 mL. Normal appearance. As on CT, both fallopian tubes are dilated. Heterogeneity and low-level echoes within, most apparent on the right. Pulsed Doppler evaluation of both ovaries demonstrates normal low-resistance arterial and venous waveforms. Other findings No abnormal free fluid. IMPRESSION: 1. Bilateral hydrosalpinx with complexity within. Findings are highly suspicious for pyosalpinx. 2. No evidence of ovarian or adnexal torsion. Electronically Signed   By: Jeronimo GreavesKyle  Talbot M.D.   On: 09/07/2018 14:28   Ct Abdomen Pelvis W Contrast  Result Date: 09/07/2018 CLINICAL DATA:  38 year old female with history of severe right lower quadrant abdominal pain for the past 4 days. Suspected appendicitis. EXAM: CT ABDOMEN AND PELVIS WITH CONTRAST TECHNIQUE: Multidetector CT imaging of the abdomen and pelvis was performed using the standard protocol following bolus administration of intravenous contrast. CONTRAST:  100mL OMNIPAQUE IOHEXOL 300 MG/ML  SOLN COMPARISON:  No priors. FINDINGS: Lower chest: Patchy areas of ground-glass attenuation and septal thickening in the  periphery of the lung bases bilaterally. Hepatobiliary: No suspicious cystic or solid hepatic lesions. No intra or extrahepatic biliary ductal dilatation. Gallbladder is normal in appearance. Pancreas: No pancreatic mass. No pancreatic ductal dilatation. No pancreatic or peripancreatic fluid collections or inflammatory changes. Spleen: Unremarkable. Adrenals/Urinary Tract: Bilateral kidneys and bilateral adrenal glands are normal in appearance. No hydroureteronephrosis. Urinary bladder is normal in appearance. Stomach/Bowel: Normal appearance of the stomach. No pathologic dilatation of small bowel or colon. Normal appendix. Vascular/Lymphatic: Aortic atherosclerosis, without evidence of aneurysm or dissection in the abdominal or pelvic vasculature. No pathologically enlarged lymph nodes in the abdomen or pelvis. Reproductive: Uterus is unremarkable in appearance. Fallopian tubes appear dilated bilaterally (right greater than left) measuring up to 3.3 cm in diameter on the right. Avid enhancement of the epithelial lining of the fallopian tubes bilaterally. Ovaries are unremarkable in appearance. Other: Trace amount of free fluid in the cul-de-sac, presumably physiologic in this young female patient. No pneumoperitoneum. Musculoskeletal: There are no aggressive appearing lytic or blastic lesions noted in the visualized portions of the skeleton. IMPRESSION: 1. Marked dilatation of the fallopian tubes bilaterally (right greater than left). This may simply represent bilateral hydrosalpinx, however, clinical correlation for signs and symptoms of tube ovarian abscess is suggested. 2. Aortic atherosclerosis. Electronically Signed   By: Trudie Reedaniel  Entrikin M.D.   On: 09/07/2018 12:08   [place] Art/ven Flow Abd Pelv Doppler  Result Date: 09/07/2018 CLINICAL DATA:  Pelvic pain for 4 days. EXAM: TRANSABDOMINAL AND TRANSVAGINAL ULTRASOUND OF PELVIS DOPPLER ULTRASOUND OF OVARIES TECHNIQUE: Both transabdominal and transvaginal  ultrasound examinations of the pelvis were performed. Transabdominal technique was performed for global imaging of the pelvis including uterus, ovaries, adnexal regions, and pelvic cul-de-sac. It was necessary to proceed with endovaginal exam following the transabdominal exam to visualize the uterus, ovaries, and adnexa. Color and duplex Doppler ultrasound was utilized to evaluate blood flow to the ovaries. COMPARISON:  11/03/2006. Today's abdominopelvic CT, dictated separately. FINDINGS: Uterus Measurements: 6.7 x 3.2 x 4.3 cm = volume: 50 mL. No fibroids or other mass visualized. Endometrium Thickness: Normal, 8 mm.  No focal abnormality visualized. Right ovary Measurements: 2.8 x 2.6 x 2.6 cm = volume: 10.2 mL. Normal appearance. Left ovary Measurements: 5.4 x 3.1 x 3.3 cm = volume: 28.8 mL. Normal appearance. As on CT, both fallopian tubes are dilated. Heterogeneity and low-level echoes within, most apparent on the right. Pulsed Doppler evaluation of both ovaries demonstrates normal low-resistance  arterial and venous waveforms. Other findings No abnormal free fluid. IMPRESSION: 1. Bilateral hydrosalpinx with complexity within. Findings are highly suspicious for pyosalpinx. 2. No evidence of ovarian or adnexal torsion. Electronically Signed   By: Abigail Miyamoto M.D.   On: 09/07/2018 14:28    Assessment/Plan: Bilateral pyosalpinx  Admit for IV antibiotics. Will check GC/C. May need IR if no improvement after 48 hrs of IV antibiotics. POC reviewed with pt.   Chancy Milroy 09/07/2018, 5:18 PM

## 2018-09-07 NOTE — Progress Notes (Signed)
Pt admitted to [facility] from [facility] from [facility] ED.  SARS pending.  Oriented to room and dept.  Zosyn started.  Temp 100.9.  Instructed pt on IS use.  Pt reports maybe a little cloudiness in her urine.  C/o pain in the right lower abd area right greater than left.

## 2018-09-07 NOTE — ED Triage Notes (Addendum)
Patient c/o right lower abdominal pain X4 days.   C/o nausea   Patient states the pain was so bad yesterday at work she felt as if she had to pass out.    Last BM Saturday morning and normal.   LMP-7/10   A/OX4 Ambulatory in triage

## 2018-09-07 NOTE — ED Notes (Signed)
I have just called report to Sharee Pimple, Therapist, sports on [place] at [place]. [facility] has just left with her.

## 2018-09-08 LAB — CBC WITH DIFFERENTIAL/PLATELET
Abs Immature Granulocytes: 0.12 10*3/uL — ABNORMAL HIGH (ref 0.00–0.07)
Basophils Absolute: 0 10*3/uL (ref 0.0–0.1)
Basophils Relative: 0 %
Eosinophils Absolute: 0 10*3/uL (ref 0.0–0.5)
Eosinophils Relative: 0 %
HCT: 36.9 % (ref 36.0–46.0)
Hemoglobin: 12.1 g/dL (ref 12.0–15.0)
Immature Granulocytes: 1 %
Lymphocytes Relative: 5 %
Lymphs Abs: 0.7 10*3/uL (ref 0.7–4.0)
MCH: 29.1 pg (ref 26.0–34.0)
MCHC: 32.8 g/dL (ref 30.0–36.0)
MCV: 88.7 fL (ref 80.0–100.0)
Monocytes Absolute: 0.8 10*3/uL (ref 0.1–1.0)
Monocytes Relative: 6 %
Neutro Abs: 11.4 10*3/uL — ABNORMAL HIGH (ref 1.7–7.7)
Neutrophils Relative %: 88 %
Platelets: 335 10*3/uL (ref 150–400)
RBC: 4.16 MIL/uL (ref 3.87–5.11)
RDW: 13.3 % (ref 11.5–15.5)
WBC: 13.1 10*3/uL — ABNORMAL HIGH (ref 4.0–10.5)
nRBC: 0 % (ref 0.0–0.2)

## 2018-09-08 LAB — WET PREP, GENITAL
Clue Cells Wet Prep HPF POC: NONE SEEN
Sperm: NONE SEEN
Trich, Wet Prep: NONE SEEN
Yeast Wet Prep HPF POC: NONE SEEN

## 2018-09-08 MED ORDER — KETOROLAC TROMETHAMINE 30 MG/ML IJ SOLN
30.0000 mg | Freq: Four times a day (QID) | INTRAMUSCULAR | Status: AC
  Administered 2018-09-08 – 2018-09-09 (×5): 30 mg via INTRAVENOUS
  Filled 2018-09-08 (×5): qty 1

## 2018-09-08 NOTE — Progress Notes (Signed)
Subjective: Patient reports continued pain, but less than yesterday. No other complaints,  Objective: I have reviewed patient's vital signs, intake and output, medications, labs and radiology results.  Patient Vitals for the past 24 hrs:  BP Temp Temp src Pulse Resp SpO2 Height Weight  09/08/18 1233 112/74 99.3 F (37.4 C) Oral 98 18 99 % - -  09/08/18 0649 115/68 99.8 F (37.7 C) Oral (!) 106 17 95 % - -  09/08/18 0643 115/68 99.8 F (37.7 C) Oral (!) 106 20 95 % - -  09/08/18 0129 105/75 98.4 F (36.9 C) Oral 94 17 97 % - -  09/07/18 2330 110/68 99.4 F (37.4 C) Oral (!) 111 17 95 % - -  09/07/18 2123 105/62 (!) 101.5 F (38.6 C) Oral (!) 114 18 98 % - -  09/07/18 1601 133/81 (!) 100.9 F (38.3 C) Oral (!) 126 19 100 % 5\' 4"  (1.626 m) 99 kg   General: alert and no distress Resp: clear to auscultation bilaterally Cardio: regular rate and rhythm GI: soft, +lower abdominal tenderness; bowel sounds normal; no masses,  no organomegaly Extremities: extremities normal, atraumatic, no cyanosis or edema and Homans sign is negative, no sign of DVT  Assessment/Plan: Continue Zosyn, will continue until 48 h afebrile Analgesia as needed Pelvic cultures obtained today Routine floor care   LOS: 1 day    Verita Schneiders, MD 09/08/2018, 3:37 PM

## 2018-09-09 ENCOUNTER — Encounter (HOSPITAL_COMMUNITY): Payer: Self-pay | Admitting: General Practice

## 2018-09-09 LAB — GC/CHLAMYDIA PROBE AMP (~~LOC~~) NOT AT ARMC
Chlamydia: NEGATIVE
Neisseria Gonorrhea: NEGATIVE

## 2018-09-09 MED ORDER — HYDROCODONE-ACETAMINOPHEN 5-325 MG PO TABS: 1.0000 | ORAL_TABLET | ORAL | 0 refills | Status: DC | PRN

## 2018-09-09 MED ORDER — METRONIDAZOLE 500 MG PO TABS: 500.0000 mg | ORAL_TABLET | Freq: Two times a day (BID) | ORAL | 0 refills | Status: AC

## 2018-09-09 MED ORDER — FLUCONAZOLE 150 MG PO TABS: 150.0000 mg | ORAL_TABLET | Freq: Once | ORAL | 3 refills | Status: AC

## 2018-09-09 MED ORDER — METRONIDAZOLE 500 MG PO TABS
500.0000 mg | ORAL_TABLET | Freq: Two times a day (BID) | ORAL | Status: DC
  Administered 2018-09-09: 500 mg via ORAL
  Filled 2018-09-09: qty 1

## 2018-09-09 MED ORDER — DOXYCYCLINE HYCLATE 100 MG PO TABS
100.0000 mg | ORAL_TABLET | Freq: Two times a day (BID) | ORAL | Status: DC
  Administered 2018-09-09: 100 mg via ORAL
  Filled 2018-09-09: qty 1

## 2018-09-09 MED ORDER — IBUPROFEN 800 MG PO TABS: 800.0000 mg | ORAL_TABLET | Freq: Three times a day (TID) | ORAL | 2 refills | Status: DC | PRN

## 2018-09-09 MED ORDER — DOXYCYCLINE HYCLATE 100 MG PO TABS: 100.0000 mg | ORAL_TABLET | Freq: Two times a day (BID) | ORAL | 0 refills | Status: AC

## 2018-09-09 NOTE — Progress Notes (Signed)
Discharged home today, friend driving her home.Discharged instructions,personal belongings given to patient.Advised to pick up medications called in to pharmacy of choice. Verbalized understanding of instructions

## 2018-09-09 NOTE — Discharge Instructions (Signed)
Pelvic Inflammatory Disease ° °Pelvic inflammatory disease (PID) is caused by an infection in some or all of the female reproductive organs. The infection can be in the uterus, ovaries, fallopian tubes, or the surrounding tissues in the pelvis. PID can cause abdominal or pelvic pain that comes on suddenly (acute pelvic pain). °PID is a serious infection because it can lead to lasting (chronic) pelvic pain or the inability to have children (infertility). °What are the causes? °This condition is most often caused by bacteria that is spread during sexual contact. It can also be caused by a bacterial infection of the vagina (bacterial vaginosis) that is not spread by sexual contact. °This condition occurs when the infection is not treated and the bacteria travel upward from the vagina or cervix into the reproductive organs. Bacteria may also be introduced into the reproductive organs following: °· The birth of a baby. °· A miscarriage. °· An abortion. °· Major pelvic surgery. °· The insertion of an intrauterine device (IUD). °· A sexual assault. °What increases the risk? °You are more likely to develop this condition if you: °· Are younger than 38 years of age. °· Are sexually active at a young age. °· Have a history of STI (sexually transmitted infection) or PID. °· Do not regularly use barrier contraception methods, such as condoms. °· Have multiple sexual partners. °· Have sex with someone who has symptoms of an STI. °· Use a vaginal douche. °· Have recently had an IUD inserted. °What are the signs or symptoms? °Symptoms of this condition include: °· Abdominal or pelvic pain. °· Fever. °· Chills. °· Abnormal vaginal discharge. °· Abnormal uterine bleeding. °· Unusual pain shortly after the end of a menstrual period. °· Painful urination. °· Pain with sex. °· Nausea and vomiting. °How is this diagnosed? °This condition is diagnosed based on a pelvic exam and medical history. A pelvic exam can reveal signs of  infection, inflammation, and discharge in the vagina and the surrounding tissues. It can also help to identify painful areas. You may also have tests, such as: °· Lab tests, including a pregnancy test, blood tests, and a urine test. °· Culture tests of the vagina and cervix to check for an STI. °· Ultrasound. °· A laparoscopic procedure to look inside the pelvis. °· Examination of vaginal discharge under a microscope. °How is this treated? °This condition may be treated with: °· Antibiotic medicines taken by mouth (orally). For more severe cases, antibiotics may be given through an IV at the hospital. °· Surgery. This is rare. Surgery may be needed if other treatments do not help. °· Efforts to stop the spread of the infection. Sexual partners may need to be treated if the infection is caused by an STI. °It may take weeks until you are completely well. If you are diagnosed with PID, you should also be checked for HIV (human immunodeficiency virus). Your health care provider may test you for infection again 3 months after treatment. You should not have unprotected sex. °Follow these instructions at home: °· Take over-the-counter and prescription medicines only as told by your health care provider. °· If you were prescribed an antibiotic medicine, take it as told by your health care provider. Do not stop using the antibiotic even if you start to feel better. °· Do not have sex until treatment is completed or as told by your health care provider. If PID is confirmed, your recent sexual partners will need treatment, especially if you had unprotected sex. °· Keep all   follow-up visits as told by your health care provider. This is important. °Contact a health care provider if: °· You have increased or abnormal vaginal discharge. °· Your pain does not improve. °· You vomit. °· You have a fever. °· You cannot tolerate your medicines. °· Your partner has an STI. °· You have pain when you urinate. °Get help right away  if: °· You have increased abdominal or pelvic pain. °· You have chills. °· Your symptoms are not better in 72 hours with treatment. °Summary °· Pelvic inflammatory disease (PID) is caused by an infection in some or all of the female reproductive organs. °· PID is a serious infection because it can lead to lasting (chronic) pelvic pain or the inability to have children (infertility). °· This infection is usually treated with antibiotic medicines. °· Do not have sex until treatment is completed or as told by your health care provider. °This information is not intended to replace advice given to you by your health care provider. Make sure you discuss any questions you have with your health care provider. °Document Released: 01/15/2005 Document Revised: 10/03/2017 Document Reviewed: 10/08/2017 °[employer]vier Patient Education © 2020 [employer] ° °

## 2018-09-09 NOTE — Discharge Summary (Signed)
Physician Discharge Summary  Patient ID: Megan Ponce MRN: [id number] DOB/AGE: 1980/03/09 38 y.o.  Admit date: 09/07/2018 Discharge date: 09/09/2018  Admission and Discharge Diagnoses: Bilateral Pyosalpinx  Hospital Course: Patient was admitted for management of bilateral pyosalpinges. She was immediately treated with Zosyn IV.  She had fevers on admission, last fever was 100.9 F on 09/07/18 around 1600.  She defervesced and pain improved.  She was switched to oral Doxycycline and Metronidazole (14 day course) after she was afebrile for about 48 hours.  Pain was controlled and she was deemed stable for discharge to home with outpatient follow up.  Consults: None  Significant Diagnostic Studies:  CBC Latest Ref Rng & Units 09/08/2018 09/07/2018 05/23/2014  WBC 4.0 - 10.5 K/uL 13.1(H) 18.5(H) 11.0(H)  Hemoglobin 12.0 - 15.0 g/dL 12.1 13.6 12.8  Hematocrit 36.0 - 46.0 % 36.9 42.3 37.8  Platelets 150 - 400 K/uL 335 342 314   US Transvaginal Non-ob  Result Date: 09/07/2018 CLINICAL DATA:  Pelvic pain for 4 days. EXAM: TRANSABDOMINAL AND TRANSVAGINAL ULTRASOUND OF PELVIS DOPPLER ULTRASOUND OF OVARIES TECHNIQUE: Both transabdominal and transvaginal ultrasound examinations of the pelvis were performed. Transabdominal technique was performed for global imaging of the pelvis including uterus, ovaries, adnexal regions, and pelvic cul-de-sac. It was necessary to proceed with endovaginal exam following the transabdominal exam to visualize the uterus, ovaries, and adnexa. Color and duplex Doppler ultrasound was utilized to evaluate blood flow to the ovaries. COMPARISON:  11/03/2006. Today's abdominopelvic CT, dictated separately. FINDINGS: Uterus Measurements: 6.7 x 3.2 x 4.3 cm = volume: 50 mL. No fibroids or other mass visualized. Endometrium Thickness: Normal, 8 mm.  No focal abnormality visualized. Right ovary Measurements: 2.8 x 2.6 x 2.6 cm = volume: 10.2 mL. Normal appearance. Left ovary Measurements: 5.4 x  3.1 x 3.3 cm = volume: 28.8 mL. Normal appearance. As on CT, both fallopian tubes are dilated. Heterogeneity and low-level echoes within, most apparent on the right. Pulsed Doppler evaluation of both ovaries demonstrates normal low-resistance arterial and venous waveforms. Other findings No abnormal free fluid. IMPRESSION: 1. Bilateral hydrosalpinx with complexity within. Findings are highly suspicious for pyosalpinx. 2. No evidence of ovarian or adnexal torsion. Electronically Signed   By: Abigail Miyamoto M.D.   On: 09/07/2018 14:28   US Pelvis Complete  Result Date: 09/07/2018 CLINICAL DATA:  Pelvic pain for 4 days. EXAM: TRANSABDOMINAL AND TRANSVAGINAL ULTRASOUND OF PELVIS DOPPLER ULTRASOUND OF OVARIES TECHNIQUE: Both transabdominal and transvaginal ultrasound examinations of the pelvis were performed. Transabdominal technique was performed for global imaging of the pelvis including uterus, ovaries, adnexal regions, and pelvic cul-de-sac. It was necessary to proceed with endovaginal exam following the transabdominal exam to visualize the uterus, ovaries, and adnexa. Color and duplex Doppler ultrasound was utilized to evaluate blood flow to the ovaries. COMPARISON:  11/03/2006. Today's abdominopelvic CT, dictated separately. FINDINGS: Uterus Measurements: 6.7 x 3.2 x 4.3 cm = volume: 50 mL. No fibroids or other mass visualized. Endometrium Thickness: Normal, 8 mm.  No focal abnormality visualized. Right ovary Measurements: 2.8 x 2.6 x 2.6 cm = volume: 10.2 mL. Normal appearance. Left ovary Measurements: 5.4 x 3.1 x 3.3 cm = volume: 28.8 mL. Normal appearance. As on CT, both fallopian tubes are dilated. Heterogeneity and low-level echoes within, most apparent on the right. Pulsed Doppler evaluation of both ovaries demonstrates normal low-resistance arterial and venous waveforms. Other findings No abnormal free fluid. IMPRESSION: 1. Bilateral hydrosalpinx with complexity within. Findings are highly suspicious for  pyosalpinx. 2. No  evidence of ovarian or adnexal torsion. Electronically Signed   By: Jeronimo GreavesKyle  Talbot M.D.   On: 09/07/2018 14:28   Ct Abdomen Pelvis W Contrast  Result Date: 09/07/2018 CLINICAL DATA:  38 year old female with history of severe right lower quadrant abdominal pain for the past 4 days. Suspected appendicitis. EXAM: CT ABDOMEN AND PELVIS WITH CONTRAST TECHNIQUE: Multidetector CT imaging of the abdomen and pelvis was performed using the standard protocol following bolus administration of intravenous contrast. CONTRAST:  100mL OMNIPAQUE IOHEXOL 300 MG/ML  SOLN COMPARISON:  No priors. FINDINGS: Lower chest: Patchy areas of ground-glass attenuation and septal thickening in the periphery of the lung bases bilaterally. Hepatobiliary: No suspicious cystic or solid hepatic lesions. No intra or extrahepatic biliary ductal dilatation. Gallbladder is normal in appearance. Pancreas: No pancreatic mass. No pancreatic ductal dilatation. No pancreatic or peripancreatic fluid collections or inflammatory changes. Spleen: Unremarkable. Adrenals/Urinary Tract: Bilateral kidneys and bilateral adrenal glands are normal in appearance. No hydroureteronephrosis. Urinary bladder is normal in appearance. Stomach/Bowel: Normal appearance of the stomach. No pathologic dilatation of small bowel or colon. Normal appendix. Vascular/Lymphatic: Aortic atherosclerosis, without evidence of aneurysm or dissection in the abdominal or pelvic vasculature. No pathologically enlarged lymph nodes in the abdomen or pelvis. Reproductive: Uterus is unremarkable in appearance. Fallopian tubes appear dilated bilaterally (right greater than left) measuring up to 3.3 cm in diameter on the right. Avid enhancement of the epithelial lining of the fallopian tubes bilaterally. Ovaries are unremarkable in appearance. Other: Trace amount of free fluid in the cul-de-sac, presumably physiologic in this young female patient. No pneumoperitoneum.  Musculoskeletal: There are no aggressive appearing lytic or blastic lesions noted in the visualized portions of the skeleton. IMPRESSION: 1. Marked dilatation of the fallopian tubes bilaterally (right greater than left). This may simply represent bilateral hydrosalpinx, however, clinical correlation for signs and symptoms of tube ovarian abscess is suggested. 2. Aortic atherosclerosis. Electronically Signed   By: Trudie Reedaniel  Entrikin M.D.   On: 09/07/2018 12:08   [place] Art/ven Flow Abd Pelv Doppler  Result Date: 09/07/2018 CLINICAL DATA:  Pelvic pain for 4 days. EXAM: TRANSABDOMINAL AND TRANSVAGINAL ULTRASOUND OF PELVIS DOPPLER ULTRASOUND OF OVARIES TECHNIQUE: Both transabdominal and transvaginal ultrasound examinations of the pelvis were performed. Transabdominal technique was performed for global imaging of the pelvis including uterus, ovaries, adnexal regions, and pelvic cul-de-sac. It was necessary to proceed with endovaginal exam following the transabdominal exam to visualize the uterus, ovaries, and adnexa. Color and duplex Doppler ultrasound was utilized to evaluate blood flow to the ovaries. COMPARISON:  11/03/2006. Today's abdominopelvic CT, dictated separately. FINDINGS: Uterus Measurements: 6.7 x 3.2 x 4.3 cm = volume: 50 mL. No fibroids or other mass visualized. Endometrium Thickness: Normal, 8 mm.  No focal abnormality visualized. Right ovary Measurements: 2.8 x 2.6 x 2.6 cm = volume: 10.2 mL. Normal appearance. Left ovary Measurements: 5.4 x 3.1 x 3.3 cm = volume: 28.8 mL. Normal appearance. As on CT, both fallopian tubes are dilated. Heterogeneity and low-level echoes within, most apparent on the right. Pulsed Doppler evaluation of both ovaries demonstrates normal low-resistance arterial and venous waveforms. Other findings No abnormal free fluid. IMPRESSION: 1. Bilateral hydrosalpinx with complexity within. Findings are highly suspicious for pyosalpinx. 2. No evidence of ovarian or adnexal torsion.  Electronically Signed   By: Jeronimo GreavesKyle  Talbot M.D.   On: 09/07/2018 14:28    Discharge Exam: Blood pressure 119/84, pulse 99, temperature 98.6 F (37 C), temperature source Oral, resp. rate 18, height 5\' 4"  (1.626 m), weight  99 kg, last menstrual period 08/08/2018, SpO2 97 %. General appearance: alert and no distress Resp: clear to auscultation bilaterally Cardio: regular rate and rhythm GI: abnormal findings:  mild TTP in lower abdomen, no rebound or guarding Pelvic: deferred Extremities: extremities normal, atraumatic, no cyanosis or edema and Homans sign is negative, no sign of DVT Skin: Skin color, texture, turgor normal. No rashes or lesions   Discharged Condition: Good  Discharge disposition: 01-Home or Self Care   Allergies as of 09/09/2018   No Known Allergies     Medication List    STOP taking these medications   cephALEXin 500 MG capsule Commonly known as: KEFLEX   diphenhydrAMINE 25 MG tablet Commonly known as: BENADRYL   polyethylene glycol powder 17 GM/SCOOP powder Commonly known as: GLYCOLAX/MIRALAX     TAKE these medications   doxycycline 100 MG tablet Commonly known as: VIBRA-TABS Take 1 tablet (100 mg total) by mouth every 12 (twelve) hours for 14 days.   fluconazole 150 MG tablet Commonly known as: DIFLUCAN Take 1 tablet (150 mg total) by mouth once for 1 dose. As needed for yeast infection. Can take additional dose three days later if symptoms persist   HYDROcodone-acetaminophen 5-325 MG tablet Commonly known as: NORCO/VICODIN Take 1 tablet by mouth every 4 (four) hours as needed for severe pain.   ibuprofen 800 MG tablet Commonly known as: ADVIL Take 1 tablet (800 mg total) by mouth 3 (three) times daily with meals as needed for mild pain, moderate pain or cramping. What changed:   medication strength  when to take this  reasons to take this   metroNIDAZOLE 500 MG tablet Commonly known as: FLAGYL Take 1 tablet (500 mg total) by mouth every  12 (twelve) hours for 14 days.        SignedJaynie Collins: Aideen Fenster 09/09/2018, 2:51 PM

## 2018-09-25 ENCOUNTER — Other Ambulatory Visit: Payer: Self-pay

## 2018-09-25 ENCOUNTER — Ambulatory Visit (INDEPENDENT_AMBULATORY_CARE_PROVIDER_SITE_OTHER): Payer: PRIVATE HEALTH INSURANCE | Admitting: Family Medicine

## 2018-09-25 ENCOUNTER — Encounter: Payer: Self-pay | Admitting: Family Medicine

## 2018-09-25 VITALS — BP 114/83 | HR 96 | Ht 63.0 in | Wt 197.0 lb

## 2018-09-25 DIAGNOSIS — Z124 Encounter for screening for malignant neoplasm of cervix: Secondary | ICD-10-CM

## 2018-09-25 DIAGNOSIS — Z01419 Encounter for gynecological examination (general) (routine) without abnormal findings: Secondary | ICD-10-CM

## 2018-09-25 DIAGNOSIS — Z803 Family history of malignant neoplasm of breast: Secondary | ICD-10-CM | POA: Insufficient documentation

## 2018-09-25 DIAGNOSIS — Z1151 Encounter for screening for human papillomavirus (HPV): Secondary | ICD-10-CM

## 2018-09-25 DIAGNOSIS — N971 Female infertility of tubal origin: Secondary | ICD-10-CM | POA: Insufficient documentation

## 2018-09-25 DIAGNOSIS — B9689 Other specified bacterial agents as the cause of diseases classified elsewhere: Secondary | ICD-10-CM | POA: Insufficient documentation

## 2018-09-25 NOTE — Assessment & Plan Note (Signed)
Condom use if needed--no baths, no douching

## 2018-09-25 NOTE — Patient Instructions (Signed)
Preventive Care 21-39 Years Old, Female Preventive care refers to visits with your health care provider and lifestyle choices that can promote health and wellness. This includes:  A yearly physical exam. This may also be called an annual well check.  Regular dental visits and eye exams.  Immunizations.  Screening for certain conditions.  Healthy lifestyle choices, such as eating a healthy diet, getting regular exercise, not using drugs or products that contain nicotine and tobacco, and limiting alcohol use. What can I expect for my preventive care visit? Physical exam Your health care provider will check your:  Height and weight. This may be used to calculate body mass index (BMI), which tells if you are at a healthy weight.  Heart rate and blood pressure.  Skin for abnormal spots. Counseling Your health care provider may ask you questions about your:  Alcohol, tobacco, and drug use.  Emotional well-being.  Home and relationship well-being.  Sexual activity.  Eating habits.  Work and work environment.  Method of birth control.  Menstrual cycle.  Pregnancy history. What immunizations do I need?  Influenza (flu) vaccine  This is recommended every year. Tetanus, diphtheria, and pertussis (Tdap) vaccine  You may need a Td booster every 10 years. Varicella (chickenpox) vaccine  You may need this if you have not been vaccinated. Human papillomavirus (HPV) vaccine  If recommended by your health care provider, you may need three doses over 6 months. Measles, mumps, and rubella (MMR) vaccine  You may need at least one dose of MMR. You may also need a second dose. Meningococcal conjugate (MenACWY) vaccine  One dose is recommended if you are age 19-21 years and a first-year college student living in a residence hall, or if you have one of several medical conditions. You may also need additional booster doses. Pneumococcal conjugate (PCV13) vaccine  You may need  this if you have certain conditions and were not previously vaccinated. Pneumococcal polysaccharide (PPSV23) vaccine  You may need one or two doses if you smoke cigarettes or if you have certain conditions. Hepatitis A vaccine  You may need this if you have certain conditions or if you travel or work in places where you may be exposed to hepatitis A. Hepatitis B vaccine  You may need this if you have certain conditions or if you travel or work in places where you may be exposed to hepatitis B. Haemophilus influenzae type b (Hib) vaccine  You may need this if you have certain conditions. You may receive vaccines as individual doses or as more than one vaccine together in one shot (combination vaccines). Talk with your health care provider about the risks and benefits of combination vaccines. What tests do I need?  Blood tests  Lipid and cholesterol levels. These may be checked every 5 years starting at age 20.  Hepatitis C test.  Hepatitis B test. Screening  Diabetes screening. This is done by checking your blood sugar (glucose) after you have not eaten for a while (fasting).  Sexually transmitted disease (STD) testing.  BRCA-related cancer screening. This may be done if you have a family history of breast, ovarian, tubal, or peritoneal cancers.  Pelvic exam and Pap test. This may be done every 3 years starting at age 21. Starting at age 30, this may be done every 5 years if you have a Pap test in combination with an HPV test. Talk with your health care provider about your test results, treatment options, and if necessary, the need for more tests.   Follow these instructions at home: Eating and drinking   Eat a diet that includes fresh fruits and vegetables, whole grains, lean protein, and low-fat dairy.  Take vitamin and mineral supplements as recommended by your health care provider.  Do not drink alcohol if: ? Your health care provider tells you not to drink. ? You are  pregnant, may be pregnant, or are planning to become pregnant.  If you drink alcohol: ? Limit how much you have to 0-1 drink a day. ? Be aware of how much alcohol is in your drink. In the [place] one drink equals one 12 oz bottle of beer (355 mL), one 5 oz glass of wine (148 mL), or one 1 oz glass of hard liquor (44 mL). Lifestyle  Take daily care of your teeth and gums.  Stay active. Exercise for at least 30 minutes on 5 or more days each week.  Do not use any products that contain nicotine or tobacco, such as cigarettes, e-cigarettes, and chewing tobacco. If you need help quitting, ask your health care provider.  If you are sexually active, practice safe sex. Use a condom or other form of birth control (contraception) in order to prevent pregnancy and STIs (sexually transmitted infections). If you plan to become pregnant, see your health care provider for a preconception visit. What's next?  Visit your health care provider once a year for a well check visit.  Ask your health care provider how often you should have your eyes and teeth checked.  Stay up to date on all vaccines. This information is not intended to replace advice given to you by your health care provider. Make sure you discuss any questions you have with your health care provider. Document Released: 03/13/2001 Document Revised: 09/26/2017 Document Reviewed: 09/26/2017 Elsevier Patient Education  2020 [employer]  

## 2018-09-25 NOTE — Progress Notes (Signed)
   Subjective:    Patient ID: Megan Ponce is a 38 y.o. female presenting with Follow-up  on 09/25/2018  HPI: Here today for f/u after hospitalization for bilateral pyosalpinges Neg GC/Chlam testing. Took all antibiotics and she is feeling much better. No further pain. Has h/o infertility and is on no contraception. Has recurrent episodes of BV.  Review of Systems  Constitutional: Negative for chills and fever.  Respiratory: Negative for shortness of breath.   Cardiovascular: Negative for chest pain.  Gastrointestinal: Negative for abdominal pain, nausea and vomiting.  Genitourinary: Negative for dysuria.  Skin: Negative for rash.      Objective:    BP 114/83   Pulse 96   Ht 5\' 3"  (1.6 m)   Wt 197 lb (89.4 kg)   LMP 09/09/2018 (Within Days)   BMI 34.90 kg/m  Physical Exam Constitutional:      General: She is not in acute distress.    Appearance: She is well-developed.  HENT:     Head: Normocephalic and atraumatic.  Eyes:     General: No scleral icterus. Neck:     Musculoskeletal: Neck supple.  Cardiovascular:     Rate and Rhythm: Normal rate.  Pulmonary:     Effort: Pulmonary effort is normal.  Abdominal:     Palpations: Abdomen is soft.  Genitourinary:    Comments: BUS normal, vagina is pink and rugated, cervix is nulliparous without lesion, uterus is small and anteverted, no adnexal mass or tenderness.  Skin:    General: Skin is warm and dry.  Neurological:     Mental Status: She is alert and oriented to person, place, and time.         Assessment & Plan:   Problem List Items Addressed This Visit      Unprioritized   Infertility, tubal origin   Family history of breast cancer    Begin annual screening mammograms at age 65      Recurrent Bacterial vaginosis    Condom use if needed--no baths, no douching       Other Visit Diagnoses    Encounter for gynecological examination without abnormal finding    -  Primary   Annual labs   Relevant  Orders   CBC   Comprehensive metabolic panel   Hemoglobin A1c   TSH   VITAMIN D 25 Hydroxy (Vit-D Deficiency, Fractures)   Lipid panel   Screening for cervical cancer       pap today   Relevant Orders   Cytology - PAP( [facility])     Return in about 1 year (around 09/25/2019).  Donnamae Jude 09/25/2018 2:26 PM

## 2018-09-25 NOTE — Assessment & Plan Note (Signed)
Begin annual screening mammograms at age 38

## 2018-09-26 LAB — COMPREHENSIVE METABOLIC PANEL
ALT: 11 IU/L (ref 0–32)
AST: 14 IU/L (ref 0–40)
Albumin/Globulin Ratio: 1.5 (ref 1.2–2.2)
Albumin: 4.4 g/dL (ref 3.8–4.8)
Alkaline Phosphatase: 65 IU/L (ref 39–117)
BUN/Creatinine Ratio: 13 (ref 9–23)
BUN: 14 mg/dL (ref 6–20)
Bilirubin Total: 0.3 mg/dL (ref 0.0–1.2)
CO2: 22 mmol/L (ref 20–29)
Calcium: 9.9 mg/dL (ref 8.7–10.2)
Chloride: 104 mmol/L (ref 96–106)
Creatinine, Ser: 1.09 mg/dL — ABNORMAL HIGH (ref 0.57–1.00)
GFR calc Af Amer: 74 mL/min/{1.73_m2} (ref >59)
GFR calc non Af Amer: 65 mL/min/{1.73_m2} (ref >59)
Globulin, Total: 2.9 g/dL (ref 1.5–4.5)
Glucose: 94 mg/dL (ref 65–99)
Potassium: 4.3 mmol/L (ref 3.5–5.2)
Sodium: 140 mmol/L (ref 134–144)
Total Protein: 7.3 g/dL (ref 6.0–8.5)

## 2018-09-26 LAB — LIPID PANEL
Chol/HDL Ratio: 2.5 ratio (ref 0.0–4.4)
Cholesterol, Total: 168 mg/dL (ref 100–199)
HDL: 66 mg/dL (ref >39)
LDL Calculated: 83 mg/dL (ref 0–99)
Triglycerides: 95 mg/dL (ref 0–149)
VLDL Cholesterol Cal: 19 mg/dL (ref 5–40)

## 2018-09-26 LAB — CBC
Hematocrit: 40 % (ref 34.0–46.6)
Hemoglobin: 13.5 g/dL (ref 11.1–15.9)
MCH: 29 pg (ref 26.6–33.0)
MCHC: 33.8 g/dL (ref 31.5–35.7)
MCV: 86 fL (ref 79–97)
Platelets: 508 10*3/uL — ABNORMAL HIGH (ref 150–450)
RBC: 4.66 x10E6/uL (ref 3.77–5.28)
RDW: 13.5 % (ref 11.7–15.4)
WBC: 8.9 10*3/uL (ref 3.4–10.8)

## 2018-09-26 LAB — HEMOGLOBIN A1C
Est. average glucose Bld gHb Est-mCnc: 117 mg/dL
Hgb A1c MFr Bld: 5.7 % — ABNORMAL HIGH (ref 4.8–5.6)

## 2018-09-26 LAB — TSH: TSH: 0.932 u[IU]/mL (ref 0.450–4.500)

## 2018-09-26 LAB — VITAMIN D 25 HYDROXY (VIT D DEFICIENCY, FRACTURES): Vit D, 25-Hydroxy: 18.6 ng/mL — ABNORMAL LOW (ref 30.0–100.0)

## 2018-09-27 ENCOUNTER — Other Ambulatory Visit: Payer: Self-pay | Admitting: Family Medicine

## 2018-09-27 DIAGNOSIS — E559 Vitamin D deficiency, unspecified: Secondary | ICD-10-CM

## 2018-09-27 MED ORDER — VITAMIN D (ERGOCALCIFEROL) 1.25 MG (50000 UNIT) PO CAPS: 50000.0000 [IU] | ORAL_CAPSULE | ORAL | 0 refills | Status: AC

## 2018-09-27 MED ORDER — OSCAL 500/200 D-3 500-200 MG-UNIT PO TABS: 1.0000 | ORAL_TABLET | Freq: Two times a day (BID) | ORAL | 2 refills | Status: DC

## 2018-09-29 LAB — CYTOLOGY - PAP
Diagnosis: NEGATIVE
HPV: NOT DETECTED

## 2018-12-16 ENCOUNTER — Emergency Department (HOSPITAL_COMMUNITY)
Admission: EM | Admit: 2018-12-16 | Discharge: 2018-12-16 | Disposition: A | Discharge status: Home / Self Care | Payer: Self-pay | Attending: Emergency Medicine | Admitting: Emergency Medicine

## 2018-12-16 ENCOUNTER — Other Ambulatory Visit: Payer: Self-pay

## 2018-12-16 ENCOUNTER — Encounter (HOSPITAL_COMMUNITY): Payer: Self-pay

## 2018-12-16 DIAGNOSIS — Y939 Activity, unspecified: Secondary | ICD-10-CM | POA: Insufficient documentation

## 2018-12-16 DIAGNOSIS — M545 Low back pain: Secondary | ICD-10-CM | POA: Insufficient documentation

## 2018-12-16 DIAGNOSIS — Y999 Unspecified external cause status: Secondary | ICD-10-CM | POA: Insufficient documentation

## 2018-12-16 DIAGNOSIS — R519 Headache, unspecified: Secondary | ICD-10-CM

## 2018-12-16 DIAGNOSIS — Z79899 Other long term (current) drug therapy: Secondary | ICD-10-CM | POA: Insufficient documentation

## 2018-12-16 DIAGNOSIS — M7918 Myalgia, other site: Secondary | ICD-10-CM

## 2018-12-16 DIAGNOSIS — Y92410 Unspecified street and highway as the place of occurrence of the external cause: Secondary | ICD-10-CM | POA: Insufficient documentation

## 2018-12-16 DIAGNOSIS — F17210 Nicotine dependence, cigarettes, uncomplicated: Secondary | ICD-10-CM | POA: Insufficient documentation

## 2018-12-16 DIAGNOSIS — S060X0A Concussion without loss of consciousness, initial encounter: Secondary | ICD-10-CM | POA: Insufficient documentation

## 2018-12-16 MED ORDER — NAPROXEN 500 MG PO TABS: 500.0000 mg | ORAL_TABLET | Freq: Two times a day (BID) | ORAL | 0 refills | Status: DC

## 2018-12-16 MED ORDER — METHOCARBAMOL 500 MG PO TABS: 500.0000 mg | ORAL_TABLET | Freq: Two times a day (BID) | ORAL | 0 refills | Status: DC
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
## 2019-06-25 ENCOUNTER — Inpatient Hospital Stay (HOSPITAL_COMMUNITY)
Admission: EM | Admit: 2019-06-25 | Discharge: 2019-06-28 | DRG: 759 | Disposition: A | Discharge status: Home / Self Care | Payer: PRIVATE HEALTH INSURANCE | Attending: Obstetrics and Gynecology | Admitting: Obstetrics and Gynecology

## 2019-06-25 ENCOUNTER — Emergency Department (HOSPITAL_COMMUNITY): Payer: PRIVATE HEALTH INSURANCE

## 2019-06-25 ENCOUNTER — Encounter: Payer: Self-pay | Admitting: Emergency Medicine

## 2019-06-25 DIAGNOSIS — F17210 Nicotine dependence, cigarettes, uncomplicated: Secondary | ICD-10-CM | POA: Diagnosis present

## 2019-06-25 DIAGNOSIS — Z79899 Other long term (current) drug therapy: Secondary | ICD-10-CM

## 2019-06-25 DIAGNOSIS — N7091 Salpingitis, unspecified: Secondary | ICD-10-CM | POA: Diagnosis not present

## 2019-06-25 DIAGNOSIS — N7093 Salpingitis and oophoritis, unspecified: Secondary | ICD-10-CM

## 2019-06-25 DIAGNOSIS — Z20822 Contact with and (suspected) exposure to covid-19: Secondary | ICD-10-CM | POA: Diagnosis present

## 2019-06-25 DIAGNOSIS — N7011 Chronic salpingitis: Principal | ICD-10-CM | POA: Diagnosis present

## 2019-06-25 LAB — WET PREP, GENITAL
Sperm: NONE SEEN
Trich, Wet Prep: NONE SEEN
Yeast Wet Prep HPF POC: NONE SEEN

## 2019-06-25 LAB — BASIC METABOLIC PANEL
Anion gap: 12 (ref 5–15)
BUN: 11 mg/dL (ref 6–20)
CO2: 18 mmol/L — ABNORMAL LOW (ref 22–32)
Calcium: 9.4 mg/dL (ref 8.9–10.3)
Chloride: 107 mmol/L (ref 98–111)
Creatinine, Ser: 0.95 mg/dL (ref 0.44–1.00)
GFR calc Af Amer: >60 mL/min (ref >60)
GFR calc non Af Amer: >60 mL/min (ref >60)
Glucose, Bld: 119 mg/dL — ABNORMAL HIGH (ref 70–99)
Potassium: 3.9 mmol/L (ref 3.5–5.1)
Sodium: 137 mmol/L (ref 135–145)

## 2019-06-25 LAB — URINALYSIS, ROUTINE W REFLEX MICROSCOPIC
Bilirubin Urine: NEGATIVE
Glucose, UA: NEGATIVE mg/dL
Hgb urine dipstick: NEGATIVE
Ketones, ur: 15 mg/dL — AB
Leukocytes,Ua: NEGATIVE
Nitrite: POSITIVE — AB
Protein, ur: NEGATIVE mg/dL
Specific Gravity, Urine: 1.025 (ref 1.005–1.030)
pH: 5.5 (ref 5.0–8.0)

## 2019-06-25 LAB — CBC
HCT: 45 % (ref 36.0–46.0)
Hemoglobin: 14.9 g/dL (ref 12.0–15.0)
MCH: 29.7 pg (ref 26.0–34.0)
MCHC: 33.1 g/dL (ref 30.0–36.0)
MCV: 89.8 fL (ref 80.0–100.0)
Platelets: 341 10*3/uL (ref 150–400)
RBC: 5.01 MIL/uL (ref 3.87–5.11)
RDW: 12.8 % (ref 11.5–15.5)
WBC: 26.5 10*3/uL — ABNORMAL HIGH (ref 4.0–10.5)
nRBC: 0 % (ref 0.0–0.2)

## 2019-06-25 LAB — URINALYSIS, MICROSCOPIC (REFLEX): RBC / HPF: NONE SEEN RBC/hpf (ref 0–5)

## 2019-06-25 LAB — LACTIC ACID, PLASMA: Lactic Acid, Venous: 1.2 mmol/L (ref 0.5–1.9)

## 2019-06-25 LAB — SARS CORONAVIRUS 2 BY RT PCR (HOSPITAL ORDER, PERFORMED IN ~~LOC~~ HOSPITAL LAB): SARS Coronavirus 2: NEGATIVE

## 2019-06-25 LAB — MRSA PCR SCREENING: MRSA by PCR: NEGATIVE

## 2019-06-25 LAB — I-STAT BETA HCG BLOOD, ED (MC, WL, AP ONLY): I-stat hCG, quantitative: <5 m[IU]/mL (ref <5)

## 2019-06-25 MED ORDER — LACTATED RINGERS IV SOLN: INTRAVENOUS | Status: DC

## 2019-06-25 MED ORDER — ONDANSETRON HCL 4 MG PO TABS
4.0000 mg | ORAL_TABLET | Freq: Four times a day (QID) | ORAL | Status: DC | PRN
  Administered 2019-06-28: 4 mg via ORAL
  Filled 2019-06-25: qty 1

## 2019-06-25 MED ORDER — SODIUM CHLORIDE 0.9 % IV SOLN
3.0000 g | Freq: Four times a day (QID) | INTRAVENOUS | Status: DC
  Administered 2019-06-25 – 2019-06-27 (×7): 3 g via INTRAVENOUS
  Filled 2019-06-25: qty 8
  Filled 2019-06-25: qty 3
  Filled 2019-06-25: qty 8
  Filled 2019-06-25 (×4): qty 3
  Filled 2019-06-25: qty 8
  Filled 2019-06-25: qty 3

## 2019-06-25 MED ORDER — IOHEXOL 350 MG/ML SOLN
100.0000 mL | Freq: Once | INTRAVENOUS | Status: AC | PRN
  Administered 2019-06-25: 100 mL via INTRAVENOUS

## 2019-06-25 MED ORDER — ONDANSETRON HCL 4 MG/2ML IJ SOLN
4.0000 mg | Freq: Once | INTRAMUSCULAR | Status: AC
  Administered 2019-06-25: 4 mg via INTRAVENOUS
  Filled 2019-06-25: qty 2

## 2019-06-25 MED ORDER — ONDANSETRON HCL 4 MG/2ML IJ SOLN: 4.0000 mg | Freq: Four times a day (QID) | INTRAMUSCULAR | Status: DC | PRN

## 2019-06-25 MED ORDER — PIPERACILLIN-TAZOBACTAM 3.375 G IVPB
3.3750 g | Freq: Once | INTRAVENOUS | Status: AC
  Administered 2019-06-25: 3.375 g via INTRAVENOUS
  Filled 2019-06-25: qty 50

## 2019-06-25 MED ORDER — PRENATAL MULTIVITAMIN CH
1.0000 | ORAL_TABLET | Freq: Every day | ORAL | Status: DC
  Administered 2019-06-26 – 2019-06-27 (×2): 1 via ORAL
  Filled 2019-06-25 (×3): qty 1

## 2019-06-25 MED ORDER — HYDROMORPHONE HCL 1 MG/ML IJ SOLN
0.5000 mg | Freq: Once | INTRAMUSCULAR | Status: AC
  Administered 2019-06-25: 0.5 mg via INTRAVENOUS
  Filled 2019-06-25: qty 1

## 2019-06-25 MED ORDER — DOCUSATE SODIUM 100 MG PO CAPS
100.0000 mg | ORAL_CAPSULE | Freq: Two times a day (BID) | ORAL | Status: DC
  Administered 2019-06-25 – 2019-06-27 (×5): 100 mg via ORAL
  Filled 2019-06-25 (×6): qty 1

## 2019-06-25 MED ORDER — OXYCODONE-ACETAMINOPHEN 5-325 MG PO TABS
1.0000 | ORAL_TABLET | ORAL | Status: DC | PRN
  Administered 2019-06-25 – 2019-06-27 (×6): 2 via ORAL
  Administered 2019-06-27: 1 via ORAL
  Filled 2019-06-25 (×2): qty 2
  Filled 2019-06-25: qty 1
  Filled 2019-06-25 (×5): qty 2

## 2019-06-25 MED ORDER — SIMETHICONE 80 MG PO CHEW: 80.0000 mg | CHEWABLE_TABLET | Freq: Four times a day (QID) | ORAL | Status: DC | PRN

## 2019-06-25 MED ORDER — SODIUM CHLORIDE 0.9 % IV BOLUS
1000.0000 mL | Freq: Once | INTRAVENOUS | Status: AC
  Administered 2019-06-25: 1000 mL via INTRAVENOUS

## 2019-06-25 NOTE — Progress Notes (Signed)
Megan Ponce is a 39 y.o. female patient admitted from ED awake, alert - oriented  X 4 - no acute distress noted.  VSS - Blood pressure 103/71, pulse 81, temperature 98.4 F (36.9 C), temperature source Oral, resp. rate 18, last menstrual period 05/06/2019, SpO2 99 %.    IV in place, occlusive dsg intact without redness.    Will cont to eval and treat per MD orders.  Rolland Porter, RN 06/25/2019 2100

## 2019-06-25 NOTE — ED Notes (Signed)
Dinner tray given to pt

## 2019-06-25 NOTE — ED Notes (Signed)
Pt in ultrasound at this time

## 2019-06-25 NOTE — H&P (Signed)
OB/GYN History and Physical  Megan Ponce is a 39 y.o. G0P0000 presenting for abdominal pain and tenderness for 1 day. Some nausea but no vomiting. No decrease in appetite, no fever. Otherwise feeling well. This pain feels similar to pain she had in August but is worse, she decided to go ahead and come in rather than wait like she did then.  Of note, she was treated for bilateral pyosalpinx in August.        History reviewed. No pertinent past medical history.  Past Surgical History:  Procedure Laterality Date  . NO PAST SURGERIES      OB History  Gravida Para Term Preterm AB Living  0 0 0 0 0 0  SAB TAB Ectopic Multiple Live Births  0 0 0 0 0    Social History   Socioeconomic History  . Marital status: Single    Spouse name: Not on file  . Number of children: Not on file  . Years of education: Not on file  . Highest education level: Not on file  Occupational History  . Not on file  Tobacco Use  . Smoking status: Current Every Day Smoker    Types: Cigarettes  . Smokeless tobacco: Never Used  Substance and Sexual Activity  . Alcohol use: No  . Drug use: No  . Sexual activity: Yes    Birth control/protection: None  Other Topics Concern  . Not on file  Social History Narrative  . Not on file   Social Determinants of Health   Financial Resource Strain:   . Difficulty of Paying Living Expenses:   Food Insecurity:   . Worried About Programme researcher, broadcasting/film/video in the Last Year:   . Barista in the Last Year:   Transportation Needs:   . Freight forwarder (Medical):   Marland Kitchen Lack of Transportation (Non-Medical):   Physical Activity:   . Days of Exercise per Week:   . Minutes of Exercise per Session:   Stress:   . Feeling of Stress :   Social Connections:   . Frequency of Communication with Friends and Family:   . Frequency of Social Gatherings with Friends and Family:   . Attends Religious Services:   . Active Member of Clubs or Organizations:   .  Attends Banker Meetings:   Marland Kitchen Marital Status:     Family History  Problem Relation Age of Onset  . Heart disease Father   . Multiple sclerosis Mother   . Breast cancer Mother 25    (Not in a hospital admission)   No Known Allergies  Review of Systems: Negative except for what is mentioned in HPI.     Physical Exam: BP 98/66   Pulse 85   Temp 98.6 F (37 C) (Oral)   Resp 18   LMP 05/06/2019   SpO2 97%  CONSTITUTIONAL: Well-developed, well-nourished female in no acute distress.  HENT:  Normocephalic, atraumatic, External right and left ear normal. Oropharynx is clear and moist EYES: Conjunctivae and EOM are normal. Pupils are equal, round, and reactive to light. No scleral icterus.  NECK: Normal range of motion, supple, no masses SKIN: Skin is warm and dry. No rash noted. Not diaphoretic. No erythema. No pallor. NEUROLGIC: Alert and oriented to person, place, and time. Normal reflexes, muscle tone coordination. No cranial nerve deficit noted. PSYCHIATRIC: Normal mood and affect. Normal behavior. Normal judgment and thought content. CARDIOVASCULAR: Normal heart rate noted RESPIRATORY: Effort normal, no problems with  respiration noted ABDOMEN: Soft, moderately tender in both lower quadrants, nondistended PELVIC: SSE: copious frothy discharge in vagina, normal appearing cervix and vaginal mucosa  Bimanual: diffuse pelvic tenderness, no masses palpapable MUSCULOSKELETAL: Normal range of motion. No edema and no tenderness. 2+ distal pulses.   Pertinent Labs/Studies:   Results for orders placed or performed during the hospital encounter of 06/25/19 (from the past 72 hour(s))  Urinalysis, Routine w reflex microscopic- may I&O cath if menses     Status: Abnormal   Collection Time: 06/25/19  7:29 AM  Result Value Ref Range   Color, Urine YELLOW YELLOW   APPearance HAZY (A) CLEAR   Specific Gravity, Urine 1.025 1.005 - 1.030   pH 5.5 5.0 - 8.0   Glucose, UA  NEGATIVE NEGATIVE mg/dL   Hgb urine dipstick NEGATIVE NEGATIVE   Bilirubin Urine NEGATIVE NEGATIVE   Ketones, ur 15 (A) NEGATIVE mg/dL   Protein, ur NEGATIVE NEGATIVE mg/dL   Nitrite POSITIVE (A) NEGATIVE   Leukocytes,Ua NEGATIVE NEGATIVE    Comment: Performed at [facility] [address] [place], [place] [address]  Urinalysis, Microscopic (reflex)     Status: Abnormal   Collection Time: 06/25/19  7:29 AM  Result Value Ref Range   RBC / HPF NONE SEEN 0 - 5 RBC/hpf   WBC, UA 0-5 0 - 5 WBC/hpf   Bacteria, UA MANY (A) NONE SEEN   Squamous Epithelial / LPF 6-10 0 - 5   Mucus PRESENT     Comment: Performed at [facility], [place] [address], [place], [place] [address]  CBC     Status: Abnormal   Collection Time: 06/25/19  7:32 AM  Result Value Ref Range   WBC 26.5 (H) 4.0 - 10.5 K/uL   RBC 5.01 3.87 - 5.11 MIL/uL   Hemoglobin 14.9 12.0 - 15.0 g/dL   HCT 45.0 36.0 - 46.0 %   MCV 89.8 80.0 - 100.0 fL   MCH 29.7 26.0 - 34.0 pg   MCHC 33.1 30.0 - 36.0 g/dL   RDW 12.8 11.5 - 15.5 %   Platelets 341 150 - 400 K/uL   nRBC 0.0 0.0 - 0.2 %    Comment: Performed at [facility], [place] [address], [place], [place] [address]  Basic metabolic panel     Status: Abnormal   Collection Time: 06/25/19  7:32 AM  Result Value Ref Range   Sodium 137 135 - 145 mmol/L   Potassium 3.9 3.5 - 5.1 mmol/L   Chloride 107 98 - 111 mmol/L   CO2 18 (L) 22 - 32 mmol/L   Glucose, Bld 119 (H) 70 - 99 mg/dL    Comment: Glucose reference range applies only to samples taken after fasting for at least 8 hours.   BUN 11 6 - 20 mg/dL   Creatinine, Ser 0.95 0.44 - 1.00 mg/dL   Calcium 9.4 8.9 - 10.3 mg/dL   GFR calc non Af Amer >60 >60 mL/min   GFR calc Af Amer >60 >60 mL/min   Anion gap 12 5 - 15    Comment: Performed at [facility] [address] [place], [place] [address]  I-Stat beta hCG blood, ED     Status: None   Collection Time: 06/25/19  8:06 AM  Result Value Ref Range   I-stat  hCG, quantitative <5.0 <5 mIU/mL   Comment 3            Comment:   GEST. AGE  CONC.  (mIU/mL)   <=1 WEEK        5 - 50     2 WEEKS       50 - 500     3 WEEKS       100 - 10,000     4 WEEKS     1,000 - 30,000        FEMALE AND NON-PREGNANT FEMALE:     LESS THAN 5 mIU/mL   SARS Coronavirus 2 by RT PCR (hospital order, performed in [facility] hospital lab) Nasopharyngeal Nasopharyngeal Swab     Status: None   Collection Time: 06/25/19 10:36 AM   Specimen: Nasopharyngeal Swab  Result Value Ref Range   SARS Coronavirus 2 NEGATIVE NEGATIVE    Comment: (NOTE) SARS-CoV-2 target nucleic acids are NOT DETECTED. The SARS-CoV-2 RNA is generally detectable in upper and lower respiratory specimens during the acute phase of infection. The lowest concentration of SARS-CoV-2 viral copies this assay can detect is 250 copies / mL. A negative result does not preclude SARS-CoV-2 infection and should not be used as the sole basis for treatment or other patient management decisions.  A negative result may occur with improper specimen collection / handling, submission of specimen other than nasopharyngeal swab, presence of viral mutation(s) within the areas targeted by this assay, and inadequate number of viral copies (<250 copies / mL). A negative result must be combined with clinical observations, patient history, and epidemiological information. Fact Sheet for Patients:   BoilerBrush.com.cy Fact Sheet for Healthcare Providers: https://pope.com/ This test is not yet approved or cleared  by the [place] FDA and has been authorized for detection and/or diagnosis of SARS-CoV-2 by FDA under an Emergency Use Authorization (EUA).  This EUA will remain in effect (meaning this test can be used) for the duration of the COVID-19 declaration under Section 564(b)(1) of the Act, 21 U.S.C. section 360bbb-3(b)(1), unless the authorization is terminated  or revoked sooner. Performed at [facility], [address], [place], [place] [address]   Lactic acid, plasma     Status: None   Collection Time: 06/25/19 10:48 AM  Result Value Ref Range   Lactic Acid, Venous 1.2 0.5 - 1.9 mmol/L    Comment: Performed at [facility], [address], [place], [place] [address]   CT IMPRESSION: 1. Fluid-filled tubular structures surrounding both ovaries most likely hydrosalpinx. Moderate thick walled enhancement is noted. Could not exclude pyosalpinx. Pelvic ultrasound may be helpful for further evaluation. 2. Patchy bibasilar infiltrates.   Electronically Signed   By: Rudie Meyer M.D.   On: 06/25/2019 14:44     Assessment and Plan :MAXIE DEBOSE is a 39 y.o. G0P0000 admitted for bilateral pyosalpinx. Enlarged fallopian tubes on CT, cannot rule out pyosalpinx. [place] read not back yet. Given elevated WBCs and pelvic tenderness, will admit for likely pyosalpinx. Differential dx includes appendicitis, however given history of pyosalpinx and bilateral enlargement, favor pyosalpinx. Will have low threshold to consult gen surg if pain does not improve with IV antibiotics. Recommend admission for IV antibiotics, patient is agreeable to plan.   Admit to med/surg unasyn Regular diet CBC in am F/u US covid swab   K. Therese Sarah, M.D. Attending Obstetrician & Gynecologist, [facility] for [employer], [facility]

## 2019-06-25 NOTE — ED Provider Notes (Signed)
Patient is a 39 year old female, no prior abdominal surgical history though she did have bilateral pyosalpinx that was treated in August with antibiotics and she did very well.  Starting yesterday she started to have increasing abdominal pain mostly in the lower abdomen mostly on the right.  On exam she does not fact have tenderness with guarding to the right abdomen but also mild diffuse abdominal tenderness, no peritoneal signs at this time.  She is borderline tachycardic, she is afebrile, she has not measured a fever at home.  She has no urinary symptoms, no vaginal discharge, no new sexual partners or STD exposures.  We will need further work-up with CT scan to elucidate the cause of her pain that did associated with a very high white blood cell count of 26,000.  Patient agreeable, anticipate possible surgical source or medical source, may need admission to the hospital.  Medical screening examination/treatment/procedure(s) were conducted as a shared visit with non-physician practitioner(s) and myself.  I personally evaluated the patient during the encounter.  Clinical Impression:   Final diagnoses:  Starla Link, MD 06/29/19 [phone]

## 2019-06-25 NOTE — ED Triage Notes (Signed)
Pt reports lower abd pain that began last night, denies n/v/d or constipation. Denies vag bleeding or discharge, endorses some chills last night, and some urinary frequency.

## 2019-06-25 NOTE — ED Notes (Signed)
Dinner Tray Ordered @ 1715.  

## 2019-06-25 NOTE — ED Provider Notes (Signed)
[facility] EMERGENCY DEPARTMENT Provider Note   CSN: [id number] Arrival date & time: 06/25/19  6333     History Chief Complaint  Patient presents with  . Abdominal Pain    Megan Ponce is a 39 y.o. female.  Patient with history of bilateral pyosalpinx, no previous abdominal surgeries presents the emergency department with complaint of abdominal pain starting early yesterday.  Patient states that around 4 PM the pain became a lot worse.  Pain was all over the abdomen but worse in the lower abdomen and on the right side.  She did not have any associated nausea, vomiting, diarrhea.  She has been passing some gas yesterday.  Pain favors the right side.  Patient denies any increased frequency or urgency, dysuria, hematuria.  No vaginal bleeding or discharge.  Patient attempted to use the restroom last night and had a syncopal episode causing some abrasions to her face.  She has a mild headache but no confusion, neck pain, trouble walking, weakness in the arms of the legs.  She took ibuprofen early in the morning with a small amount of liquid.  Otherwise no oral intake since 3 PM yesterday.        History reviewed. No pertinent past medical history.  Patient Active Problem List   Diagnosis Date Noted  . 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 Family history of breast cancer 09/25/2018  . 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 Pyosalpinx 09/07/2018    Past Surgical History:  Procedure Laterality Date  . NO PAST SURGERIES       OB History    Gravida  0   Para  0   Term  0   Preterm  0   AB  0   Living  0     SAB  0   TAB  0   Ectopic  0   Multiple  0   Live Births  0           Family History  Problem Relation Age of Onset  . Heart disease Father   . Multiple sclerosis Mother   . Breast cancer Mother 62    Social History   Tobacco Use  . Smoking status: Current Every Day Smoker    Types: Cigarettes  . Smokeless tobacco:  Never Used  Substance Use Topics  . Alcohol use: No  . Drug use: No    Home Medications Prior to Admission medications   Medication Sig Start Date End Date Taking? Authorizing Provider  calcium-vitamin D (OSCAL 500/200 D-3) 500-200 MG-UNIT tablet Take 1 tablet by mouth 2 (two) times daily. 09/27/18   Reva Bores, MD  methocarbamol (ROBAXIN) 500 MG tablet Take 1 tablet (500 mg total) by mouth 2 (two) times daily. 12/16/18   Venter, Margaux, PA-C  naproxen (NAPROSYN) 500 MG tablet Take 1 tablet (500 mg total) by mouth 2 (two) times daily. 12/16/18   Tanda Rockers, PA-C    Allergies    Patient has no known allergies.  Review of Systems   Review of Systems  Constitutional: Negative for fever.  HENT: Negative for rhinorrhea and sore throat.   Eyes: Negative for redness.  Respiratory: Negative for cough.   Cardiovascular: Negative for chest pain.  Gastrointestinal: Positive for abdominal pain, nausea and vomiting. Negative for diarrhea.  Genitourinary: Negative for dysuria, vaginal bleeding and vaginal discharge.  Musculoskeletal: Negative for myalgias.  Skin: Negative for rash.  Neurological: Positive for syncope and headaches.    Physical Exam  Updated Vital Signs BP 109/61 (BP Location: Right Arm)   Pulse (!) 104   Temp 98.6 F (37 C) (Oral)   Resp 18   LMP 05/06/2019   SpO2 98%   Physical Exam Vitals and nursing note reviewed.  Constitutional:      Appearance: She is well-developed.  HENT:     Head: Normocephalic and atraumatic.  Eyes:     General:        Right eye: No discharge.        Left eye: No discharge.     Conjunctiva/sclera: Conjunctivae normal.  Cardiovascular:     Rate and Rhythm: Regular rhythm. Tachycardia present.     Heart sounds: Normal heart sounds.  Pulmonary:     Effort: Pulmonary effort is normal.     Breath sounds: Normal breath sounds.  Abdominal:     General: There is no distension.     Tenderness: There is generalized abdominal  tenderness and tenderness in the right lower quadrant. There is guarding. There is no rebound. Positive signs include McBurney's sign. Negative signs include Murphy's sign and Rovsing's sign.  Musculoskeletal:     Cervical back: Normal range of motion and neck supple.  Skin:    General: Skin is warm and dry.  Neurological:     Mental Status: She is alert.     ED Results / Procedures / Treatments   Labs (all labs ordered are listed, but only abnormal results are displayed) Labs Reviewed  URINALYSIS, ROUTINE W REFLEX MICROSCOPIC - Abnormal; Notable for the following components:      Result Value   APPearance HAZY (*)    Ketones, ur 15 (*)    Nitrite POSITIVE (*)    All other components within normal limits  CBC - Abnormal; Notable for the following components:   WBC 26.5 (*)    All other components within normal limits  BASIC METABOLIC PANEL - Abnormal; Notable for the following components:   CO2 18 (*)    Glucose, Bld 119 (*)    All other components within normal limits  URINALYSIS, MICROSCOPIC (REFLEX) - Abnormal; Notable for the following components:   Bacteria, UA MANY (*)    All other components within normal limits  SARS CORONAVIRUS 2 BY RT PCR (HOSPITAL ORDER, PERFORMED IN [facility])  LACTIC ACID, PLASMA  I-STAT BETA HCG BLOOD, ED ([facility], [facility], [facility] ONLY)    EKG None  Radiology CT ABDOMEN PELVIS W CONTRAST  Result Date: 06/25/2019 CLINICAL DATA:  Abdominal pain. EXAM: CT ABDOMEN AND PELVIS WITH CONTRAST TECHNIQUE: Multidetector CT imaging of the abdomen and pelvis was performed using the standard protocol following bolus administration of intravenous contrast. CONTRAST:  100 cc Omnipaque 300 FINDINGS: Lower chest: Patchy bibasilar infiltrates. No pleural effusion or pulmonary lesions. The heart is normal in size. No pericardial effusion. Hepatobiliary: No hepatic lesions or intrahepatic biliary dilatation. The gallbladder appears normal. No common bile duct  dilatation. Pancreas: No mass, inflammation or ductal dilatation. Spleen: Normal size.  No focal lesions. Adrenals/Urinary Tract: The adrenal glands and kidneys are unremarkable. No renal, ureteral or bladder calculi or mass. Stomach/Bowel: The stomach, duodenum and small bowel are grossly normal without oral contrast. There are some fluid-filled loops of small bowel but no significant air-fluid levels. The distal and terminal ileum appear normal. Some fluid in the right colon but the rest of the colon is unremarkable. Vascular/Lymphatic: The aorta is normal in caliber. No dissection. The branch vessels are patent. The major venous  structures are patent. No mesenteric or retroperitoneal mass or adenopathy. Small scattered lymph nodes are noted. Reproductive: Fluid-filled tubular serpiginous structures surrounding both ovaries most likely hydrosalpinx. Moderate thick walled enhancement is noted. Could not exclude pyosalpinx. The ovaries are grossly normal. No discrete TOA. Pelvic ultrasound may be helpful for further evaluation. The uterus is unremarkable. Other: No free pelvic fluid collections or pelvic abscess. No pelvic or inguinal adenopathy. Musculoskeletal: No significant bony findings. IMPRESSION: 1. Fluid-filled tubular structures surrounding both ovaries most likely hydrosalpinx. Moderate thick walled enhancement is noted. Could not exclude pyosalpinx. Pelvic ultrasound may be helpful for further evaluation. 2. Patchy bibasilar infiltrates. Electronically Signed   By: Marijo Sanes M.D.   On: 06/25/2019 14:44    Procedures Procedures (including critical care time)  Medications Ordered in ED Medications  piperacillin-tazobactam (ZOSYN) IVPB 3.375 g (0 g Intravenous Stopped 06/25/19 1353)  HYDROmorphone (DILAUDID) injection 0.5 mg (0.5 mg Intravenous Given 06/25/19 1033)  ondansetron (ZOFRAN) injection 4 mg (4 mg Intravenous Given 06/25/19 1033)  sodium chloride 0.9 % bolus 1,000 mL (0 mLs Intravenous  Stopped 06/25/19 1352)    ED Course  I have reviewed the triage vital signs and the nursing notes.  Pertinent labs & imaging results that were available during my care of the patient were reviewed by me and considered in my medical decision making (see chart for details).  Patient seen and examined. Work-up initiated. Medications ordered.   Vital signs reviewed and are as follows: BP 109/61 (BP Location: Right Arm)   Pulse (!) 104   Temp 98.6 F (37 C) (Oral)   Resp 18   LMP 05/06/2019   SpO2 98%   Patient with concerning exam. Concern for recurrent pyosalpinx vs appendicitis.  Zosyn ordered.  Will check lactate.  Will give medication for pain, nausea, IV fluids.  CT imaging is pending.  3:09 PM CT reviewed.  Discussed patient's case with Dr. Rosana Hoes OB/GYN.  Plan is for admission to the hospital for IV antibiotics.  Dr. Rosana Hoes requests pelvic ultrasound be performed.  These are ordered.  Patient updated and agrees with plan.  Pain is currently controlled.    MDM Rules/Calculators/A&P                      Patient with recurrent bilateral hydro-/pyosalpinx.  Admit for IV antibiotics given white blood cell count of 26,000.   Final Clinical Impression(s) / ED Diagnoses Final diagnoses:  Pyosalpinx    Rx / DC Orders ED Discharge Orders    None       Carlisle Cater, PA-C 06/25/19 1511    Noemi Chapel, MD 06/29/19 [phone]

## 2019-06-26 LAB — CBC WITH DIFFERENTIAL/PLATELET
Abs Immature Granulocytes: 0.08 10*3/uL — ABNORMAL HIGH (ref 0.00–0.07)
Basophils Absolute: 0 10*3/uL (ref 0.0–0.1)
Basophils Relative: 0 %
Eosinophils Absolute: 0.1 10*3/uL (ref 0.0–0.5)
Eosinophils Relative: 1 %
HCT: 41 % (ref 36.0–46.0)
Hemoglobin: 13.2 g/dL (ref 12.0–15.0)
Immature Granulocytes: 1 %
Lymphocytes Relative: 7 %
Lymphs Abs: 1 10*3/uL (ref 0.7–4.0)
MCH: 30.2 pg (ref 26.0–34.0)
MCHC: 32.2 g/dL (ref 30.0–36.0)
MCV: 93.8 fL (ref 80.0–100.0)
Monocytes Absolute: 0.7 10*3/uL (ref 0.1–1.0)
Monocytes Relative: 4 %
Neutro Abs: 13.9 10*3/uL — ABNORMAL HIGH (ref 1.7–7.7)
Neutrophils Relative %: 87 %
Platelets: 271 10*3/uL (ref 150–400)
RBC: 4.37 MIL/uL (ref 3.87–5.11)
RDW: 13.2 % (ref 11.5–15.5)
WBC: 15.8 10*3/uL — ABNORMAL HIGH (ref 4.0–10.5)
nRBC: 0 % (ref 0.0–0.2)

## 2019-06-26 LAB — CERVICOVAGINAL ANCILLARY ONLY
Chlamydia: NEGATIVE
Comment: NEGATIVE
Comment: NORMAL
Neisseria Gonorrhea: NEGATIVE

## 2019-06-26 NOTE — Progress Notes (Signed)
Subjective: Patient reports nausea, tolerating PO, + flatus and no problems voiding.  Still continued bilateral pain, 6/10, pain meds help.   Objective: I have reviewed patient's vital signs, intake and output, medications and labs.  General: alert and cooperative GI: normal findings: no masses palpable, no organomegaly and nondistended and abnormal findings:  rebound tenderness and no gaurding Extremities: extremities normal, atraumatic, no cyanosis or edema   Assessment/Plan: 38yo G0 presents with abd pain; hydrosalpinx bilat noted on CT cannot r/o pyosalpinx 1. Susp pyosalpinx: on unisom, cont. IV abx, wcnt 26, CBC AM pending.  2. Dispo: as wBC trends down, pain improves switch to oral abx.   LOS: 1 day    Megan Ponce 06/26/2019, 7:55 AM

## 2019-06-27 DIAGNOSIS — N7011 Chronic salpingitis: Principal | ICD-10-CM

## 2019-06-27 LAB — CBC WITH DIFFERENTIAL/PLATELET
Abs Immature Granulocytes: 0.06 10*3/uL (ref 0.00–0.07)
Basophils Absolute: 0 10*3/uL (ref 0.0–0.1)
Basophils Relative: 0 %
Eosinophils Absolute: 0.1 10*3/uL (ref 0.0–0.5)
Eosinophils Relative: 1 %
HCT: 39.7 % (ref 36.0–46.0)
Hemoglobin: 12.6 g/dL (ref 12.0–15.0)
Immature Granulocytes: 0 %
Lymphocytes Relative: 8 %
Lymphs Abs: 1.1 10*3/uL (ref 0.7–4.0)
MCH: 29.7 pg (ref 26.0–34.0)
MCHC: 31.7 g/dL (ref 30.0–36.0)
MCV: 93.6 fL (ref 80.0–100.0)
Monocytes Absolute: 0.7 10*3/uL (ref 0.1–1.0)
Monocytes Relative: 5 %
Neutro Abs: 12.4 10*3/uL — ABNORMAL HIGH (ref 1.7–7.7)
Neutrophils Relative %: 86 %
Platelets: 297 10*3/uL (ref 150–400)
RBC: 4.24 MIL/uL (ref 3.87–5.11)
RDW: 13.2 % (ref 11.5–15.5)
WBC: 14.4 10*3/uL — ABNORMAL HIGH (ref 4.0–10.5)
nRBC: 0 % (ref 0.0–0.2)

## 2019-06-27 MED ORDER — METRONIDAZOLE 500 MG PO TABS
500.0000 mg | ORAL_TABLET | Freq: Two times a day (BID) | ORAL | Status: DC
  Administered 2019-06-27 – 2019-06-28 (×3): 500 mg via ORAL
  Filled 2019-06-27 (×3): qty 1

## 2019-06-27 MED ORDER — DOXYCYCLINE HYCLATE 100 MG PO TABS
100.0000 mg | ORAL_TABLET | Freq: Two times a day (BID) | ORAL | Status: DC
  Administered 2019-06-27 – 2019-06-28 (×3): 100 mg via ORAL
  Filled 2019-06-27 (×3): qty 1

## 2019-06-27 NOTE — Progress Notes (Signed)
Gynecology Progress Note  Admission Date: 06/25/2019 Current Date: 06/27/2019 6:11 AM  Megan Ponce is a 39 y.o. G0 HD#3 admitted for recurrent b/l hydrosalpinx   History complicated by: Patient Active Problem List   Diagnosis Date Noted  . Hydrosalpinx 06/25/2019  . 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 Family history of breast cancer 09/25/2018  . 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 Pyosalpinx 09/07/2018    ROS and patient/family/surgical history, located on admission H&P note dated 06/25/2019, have been reviewed, and there are no changes except as noted below Yesterday/Overnight Events:  none  Subjective:  Feeling well, no constitutional s/s. Still a little sore in the lower belly  Objective:    Current Vital Signs 24h Vital Sign Ranges  T 99 F (37.2 C) Temp  Avg: 99.4 F (37.4 C)  Min: 99 F (37.2 C)  Max: 99.7 F (37.6 C)  BP 115/82 BP  Min: 115/82  Max: 117/76  HR (!) 102 Pulse  Avg: 104  Min: 102  Max: 106  RR 18 Resp  Avg: 18  Min: 18  Max: 18  SaO2 91 % Room Air SpO2  Avg: 91 %  Min: 91 %  Max: 91 %       24 Hour I/O Current Shift I/O  Time Ins Outs 05/28 0701 - 05/29 0700 In: 2541.3 [P.O.:520; I.V.:1513.4] Out: -  05/28 1901 - 05/29 0700 In: 790.3 [I.V.:590.2] Out: -    Physical exam: General appearance: alert, cooperative and appears stated age Abdomen: soft, non-tender; bowel sounds normal; no masses,  no organomegaly Lungs: clear to auscultation bilaterally Heart: S1, S2 normal, no murmur, rub or gallop, regular rate and rhythm Extremities: no c/c/e Skin: warm and dry Psych: appropriate Neurologic: Grossly normal  Medications Current Facility-Administered Medications  Medication Dose Route Frequency Provider Last Rate Last Admin  . Ampicillin-Sulbactam (UNASYN) 3 g in sodium chloride 0.9 % 100 mL IVPB  3 g Intravenous Q6H Sloan Leiter, MD   Stopped at 06/27/19 [id number]  . docusate sodium (COLACE) capsule 100 mg  100 mg Oral BID Sloan Leiter, MD   100 mg at 06/26/19 2132  . lactated ringers infusion   Intravenous Continuous Sloan Leiter, MD 75 mL/hr at 06/27/19 0354 Rate Verify at 06/27/19 0354  . ondansetron (ZOFRAN) tablet 4 mg  4 mg Oral Q6H PRN Sloan Leiter, MD       Or  . ondansetron [facility]) injection 4 mg  4 mg Intravenous Q6H PRN Sloan Leiter, MD      . oxyCODONE-acetaminophen (PERCOCET/ROXICET) 5-325 MG per tablet 1-2 tablet  1-2 tablet Oral Q3H PRN Sloan Leiter, MD   2 tablet at 06/26/19 2132  . prenatal multivitamin tablet 1 tablet  1 tablet Oral Q1200 Sloan Leiter, MD   1 tablet at 06/26/19 1224  . simethicone (MYLICON) chewable tablet 80 mg  80 mg Oral QID PRN Sloan Leiter, MD          Labs  Recent Labs  Lab 06/25/19 0732 06/26/19 0809 06/27/19 0348  WBC 26.5* 15.8* 14.4*  HGB 14.9 13.2 12.6  HCT 45.0 41.0 39.7  PLT 341 271 297    Recent Labs  Lab 06/25/19 0732  NA 137  K 3.9  CL 107  CO2 18*  BUN 11  CREATININE 0.95  CALCIUM 9.4  GLUCOSE 119*    Radiology No new imaging  Assessment & Plan:  Pt doing well *GYN: switch from Unasyn (D#3) to  PO doxy and flagyl. I told her that can likely go home later today or tomorrow morning. Pt is a G0 and wants to try to conceive. I told her that this is unlikely or has a high risk of an ectopic. I told her I recommend doing surgery in 3 months, at the earliest, and to remove both tubes. I told her at the very least to either do an HSG or a laparoscopic dye test after 60m to see if her tubes are even functional because if not, then I recommend removal b/c they are just a nidus for recurrent infection *Pain: doing well on PO meds *FEN/GI: regular diet, SLIV *PPx: SCDs, OOB ad lib *Dispo: later today or tomorrow morning  Code Status: Full Code  Total time taking care of the patient was 15 minutes, with greater than 50% of the time spent in face to face interaction with the patient  Cornelia Copa MD Attending Center for [facility]  Healthcare (Faculty Practice) GYN Consult Phone: [phone] (M-F, [phone]) & [phone] (Off hours, weekends, holidays)

## 2019-06-28 LAB — CBC WITH DIFFERENTIAL/PLATELET
Abs Immature Granulocytes: 0.05 10*3/uL (ref 0.00–0.07)
Basophils Absolute: 0 10*3/uL (ref 0.0–0.1)
Basophils Relative: 0 %
Eosinophils Absolute: 0.2 10*3/uL (ref 0.0–0.5)
Eosinophils Relative: 2 %
HCT: 38.3 % (ref 36.0–46.0)
Hemoglobin: 12.8 g/dL (ref 12.0–15.0)
Immature Granulocytes: 0 %
Lymphocytes Relative: 12 %
Lymphs Abs: 1.5 10*3/uL (ref 0.7–4.0)
MCH: 29.6 pg (ref 26.0–34.0)
MCHC: 33.4 g/dL (ref 30.0–36.0)
MCV: 88.5 fL (ref 80.0–100.0)
Monocytes Absolute: 0.8 10*3/uL (ref 0.1–1.0)
Monocytes Relative: 6 %
Neutro Abs: 10 10*3/uL — ABNORMAL HIGH (ref 1.7–7.7)
Neutrophils Relative %: 80 %
Platelets: 314 10*3/uL (ref 150–400)
RBC: 4.33 MIL/uL (ref 3.87–5.11)
RDW: 12.6 % (ref 11.5–15.5)
WBC: 12.6 10*3/uL — ABNORMAL HIGH (ref 4.0–10.5)
nRBC: 0 % (ref 0.0–0.2)

## 2019-06-28 MED ORDER — METRONIDAZOLE 500 MG PO TABS: 500.0000 mg | ORAL_TABLET | Freq: Two times a day (BID) | ORAL | 0 refills | Status: AC

## 2019-06-28 MED ORDER — ONDANSETRON HCL 4 MG PO TABS: 4.0000 mg | ORAL_TABLET | Freq: Four times a day (QID) | ORAL | 0 refills | Status: DC | PRN

## 2019-06-28 MED ORDER — DOCUSATE SODIUM 100 MG PO CAPS: 100.0000 mg | ORAL_CAPSULE | Freq: Two times a day (BID) | ORAL | 1 refills | Status: AC

## 2019-06-28 MED ORDER — OXYCODONE-ACETAMINOPHEN 5-325 MG PO TABS: 1.0000 | ORAL_TABLET | ORAL | 0 refills | Status: DC | PRN

## 2019-06-28 MED ORDER — NAPROXEN 500 MG PO TABS
500.0000 mg | ORAL_TABLET | Freq: Two times a day (BID) | ORAL | 0 refills | Status: DC | PRN
Start: 2019-06-28 — End: 2019-10-22

## 2019-06-28 MED ORDER — DOXYCYCLINE HYCLATE 100 MG PO TABS: 100.0000 mg | ORAL_TABLET | Freq: Two times a day (BID) | ORAL | 0 refills | Status: AC

## 2019-06-28 NOTE — Progress Notes (Signed)
Pt DC scheduled for today. Pt provided with e-prescriptions, appt reminders, and educational information. Pt verbalized understanding of discharge instructions. Pt ambulated off unit independently without incident.

## 2019-06-28 NOTE — Discharge Summary (Addendum)
Physician Discharge Summary  Patient ID: Megan Ponce MRN: [id number] DOB/AGE: [birth year] 39 y.o.  Admit date: 06/25/2019 Discharge date: 06/28/2019  Admission Diagnoses: Recurrent abdominal pain due to bilateral hydrosalpinges  Discharge Diagnoses: Resolving abdominal pain  Discharged Condition: good  Hospital Course: Patient admitted and started on IV Unasyn and was transitioned to PO doxycycline and flagyl on HD#3. She remained afebrile during her hospitalization and had a downtrending white count and was discharged to home on a 14 day course of PO antibiotics  CBC Latest Ref Rng & Units 06/28/2019 06/27/2019 06/26/2019  WBC 4.0 - 10.5 K/uL 12.6(H) 14.4(H) 15.8(H)  Hemoglobin 12.0 - 15.0 g/dL 71.6 96.7 89.3  Hematocrit 36.0 - 46.0 % 38.3 39.7 41.0  Platelets 150 - 400 K/uL 314 297 271   CLINICAL DATA:  Elevated white blood cell count, hydrosalpinx, right-sided pelvic pain since yesterday  EXAM: TRANSABDOMINAL AND TRANSVAGINAL ULTRASOUND OF PELVIS  TECHNIQUE: Both transabdominal and transvaginal ultrasound examinations of the pelvis were performed. Transabdominal technique was performed for global imaging of the pelvis including uterus, ovaries, adnexal regions, and pelvic cul-de-sac. It was necessary to proceed with endovaginal exam following the transabdominal exam to visualize the adnexal structures.  COMPARISON:  06/25/2019 CT  FINDINGS: Uterus  Measurements: 8.2 x 3.5 x 4.5 cm = volume: 67.5 mL. No fibroids or other mass visualized.  Endometrium  Thickness: 8 mL.  No focal abnormality visualized.  Right ovary  Measurements: 3.2 x 2.5 x 2.2 cm = volume: 9 mL. Normal appearance/no adnexal mass.  Left ovary  Measurements: 1.9 x 2.4 x 2.3 cm = volume: 5.4 mL. Normal appearance/no adnexal mass.  Other findings  Bilateral thick-walled fluid-filled tubular structures are identified similar to CT scan, consistent with fluid-filled fallopian  tubes. The fallopian tubes demonstrate marked increased vascularity, compatible with pelvic inflammatory disease. There is trace free fluid within the cul-de-sac.  IMPRESSION: 1. Findings compatible with pelvic inflammatory disease, with bilateral hydrosalpinx demonstrating marked mural thickening and increased vascularity. No evidence of abscess. 2. Trace pelvic free fluid. 3. Unremarkable appearance of the uterus and ovaries.   Electronically Signed   By: Sharlet Salina M.D.   On: 06/25/2019 16:47  CLINICAL DATA:  Abdominal pain.  EXAM: CT ABDOMEN AND PELVIS WITH CONTRAST  TECHNIQUE: Multidetector CT imaging of the abdomen and pelvis was performed using the standard protocol following bolus administration of intravenous contrast.  CONTRAST:  100 cc Omnipaque 300  FINDINGS: Lower chest: Patchy bibasilar infiltrates. No pleural effusion or pulmonary lesions. The heart is normal in size. No pericardial effusion.  Hepatobiliary: No hepatic lesions or intrahepatic biliary dilatation. The gallbladder appears normal. No common bile duct dilatation.  Pancreas: No mass, inflammation or ductal dilatation.  Spleen: Normal size.  No focal lesions.  Adrenals/Urinary Tract: The adrenal glands and kidneys are unremarkable. No renal, ureteral or bladder calculi or mass.  Stomach/Bowel: The stomach, duodenum and small bowel are grossly normal without oral contrast. There are some fluid-filled loops of small bowel but no significant air-fluid levels. The distal and terminal ileum appear normal.  Some fluid in the right colon but the rest of the colon is unremarkable.  Vascular/Lymphatic: The aorta is normal in caliber. No dissection. The branch vessels are patent. The major venous structures are patent. No mesenteric or retroperitoneal mass or adenopathy. Small scattered lymph nodes are noted.  Reproductive: Fluid-filled tubular serpiginous  structures surrounding both ovaries most likely hydrosalpinx. Moderate thick walled enhancement is noted. Could not exclude pyosalpinx. The ovaries are grossly  normal. No discrete TOA. Pelvic ultrasound may be helpful for further evaluation.  The uterus is unremarkable.  Other: No free pelvic fluid collections or pelvic abscess. No pelvic or inguinal adenopathy.  Musculoskeletal: No significant bony findings.  IMPRESSION: 1. Fluid-filled tubular structures surrounding both ovaries most likely hydrosalpinx. Moderate thick walled enhancement is noted. Could not exclude pyosalpinx. Pelvic ultrasound may be helpful for further evaluation. 2. Patchy bibasilar infiltrates.   Electronically Signed   By: Marijo Sanes M.D.   On: 06/25/2019 14:44  Consults: None  Significant Diagnostic Studies: ultrasound, CT  Discharge Exam: Blood pressure 124/82, pulse (!) 101, temperature 98.2 F (36.8 C), temperature source Oral, resp. rate 18, last menstrual period 05/06/2019, SpO2 95 %. NAD +BS, soft, non distended, minimally ttp in lower belly area, no peritoneal s/s CTAB Normal s1 and s2, no MRGs Skin warm and dry  Disposition: Discharge disposition: 01-Home or Self Care       Discharge Instructions    Discharge patient   Complete by: As directed    After AM antibiotics are given. Okay to give them early   Discharge disposition: 01-Home or Self Care   Discharge patient date: 06/28/2019     Allergies as of 06/28/2019   No Known Allergies     Medication List    STOP taking these medications   ibuprofen 800 MG tablet Commonly known as: ADVIL     TAKE these medications   docusate sodium 100 MG capsule Commonly known as: COLACE Take 1 capsule (100 mg total) by mouth 2 (two) times daily.   doxycycline 100 MG tablet Commonly known as: VIBRA-TABS Take 1 tablet (100 mg total) by mouth every 12 (twelve) hours for 12 days.   methocarbamol 500 MG tablet Commonly known  as: ROBAXIN Take 1 tablet (500 mg total) by mouth 2 (two) times daily.   metroNIDAZOLE 500 MG tablet Commonly known as: FLAGYL Take 1 tablet (500 mg total) by mouth every 12 (twelve) hours for 12 days.   naproxen 500 MG tablet Commonly known as: NAPROSYN Take 1 tablet (500 mg total) by mouth 2 (two) times daily as needed. What changed:   when to take this  reasons to take this   ondansetron 4 MG tablet Commonly known as: ZOFRAN Take 1 tablet (4 mg total) by mouth every 6 (six) hours as needed for nausea.   Oscal 500/200 D-3 500-200 MG-UNIT tablet Generic drug: calcium-vitamin D Take 1 tablet by mouth 2 (two) times daily.   oxyCODONE-acetaminophen 5-325 MG tablet Commonly known as: PERCOCET/ROXICET Take 1-2 tablets by mouth every 3 (three) hours as needed (moderate to severe pain (when tolerating fluids)).      Follow-up [facility] Follow up in 1 week(s).   Specialty: Obstetrics and Gynecology Why: we will call you with an appointment for late this week or early next week Contact information: [place] [phone]          Signed: Aletha Halim 06/28/2019, 9:01 AM

## 2019-06-28 NOTE — Discharge Instructions (Signed)
Pelvic Inflammatory Disease  Pelvic inflammatory disease (PID) is an infection in some or all of the female reproductive organs. PID can be in the womb (uterus), ovaries, fallopian tubes, or nearby tissues that are inside the lower belly area (pelvis). PID can lead to problems if it is not treated. What are the causes?  Germs (bacteria) that are spread during sex. This is the most common cause.  Germs in the vagina that are not spread during sex.  Germs that travel up from the vagina or cervix to the reproductive organs after: ? The birth of a baby. ? A miscarriage. ? An abortion. ? Pelvic surgery. ? Insertion of an intrauterine device (IUD). ? A sexual assault. What increases the risk?  Being younger than 39 years old.  Having sex at a young age.  Having a history of STI (sexually transmitted infection) or PID.  Not using barrier birth control, such as condoms.  Having a lot of sex partners.  Having sex with someone who has symptoms of an STI.  Using a douche.  Having an IUD put in place. What are the signs or symptoms?  Pain in the belly area.  Fever.  Chills.  Discharge from the vagina that is not normal.  Bleeding from the womb that is not normal.  Pain soon after the end of a menstrual period.  Pain when you pee (urinate).  Pain with sex.  Feeling sick to your stomach (nauseous) or throwing up (vomiting). How is this treated?  Antibiotic medicines. In very bad cases, these may be given through an IV tube.  Surgery. This is rare.  Efforts to stop the spread of the infection. Sex partners may need to be treated. It may take weeks until you feel all better. Your doctor may test you for infection again after you finish treatment. You should also be checked for HIV (human immunodeficiency virus). Follow these instructions at home:  Take over-the-counter and prescription medicines only as told by your doctor.  If you were prescribed an antibiotic  medicine, take it as told by your doctor. Do not stop taking it even if you start to feel better.  Do not have sex until treatment is done or as told by your doctor.  Tell your sex partner if you have PID. Your partner may need to be treated.  Keep all follow-up visits as told by your doctor. This is important. Contact a doctor if:  You have more fluid or fluid that is not normal coming from your vagina.  Your pain does not improve.  You throw up.  You have a fever.  You cannot take your medicines.  Your partner has an STI.  You have pain when you pee. Get help right away if:  You have more pain in the belly area.  You have chills.  You are not better in 72 hours with treatment. Summary  Pelvic inflammatory disease (PID) is caused by an infection in some or all of the female reproductive organs.  PID is a serious infection.  This infection is most often treated with antibiotics.  Do not have sex until treatment is done or as told by your doctor. This information is not intended to replace advice given to you by your health care provider. Make sure you discuss any questions you have with your health care provider. Document Revised: 10/03/2017 Document Reviewed: 10/09/2017 Elsevier Patient Education  2020 [employer]  

## 2019-06-30 ENCOUNTER — Telehealth: Payer: Self-pay | Admitting: Family Medicine

## 2019-06-30 ENCOUNTER — Telehealth (INDEPENDENT_AMBULATORY_CARE_PROVIDER_SITE_OTHER): Payer: PRIVATE HEALTH INSURANCE | Admitting: Family Medicine

## 2019-06-30 DIAGNOSIS — Z0189 Encounter for other specified special examinations: Secondary | ICD-10-CM

## 2019-06-30 DIAGNOSIS — N7011 Chronic salpingitis: Secondary | ICD-10-CM

## 2019-06-30 NOTE — Telephone Encounter (Signed)
Returned patients call about her meds.   She reports the Doxycycline is making her sick. She took it with an apple. Advised patient to try after a meal. She has not taken today.   She has not been able to get her Flagyl yet and plans to pick it up today.   Reviewed that if taking with a meal and she continues to have issues to let the office know and we can reach out to Dr. Vergie Living to see if there is an alternative.   Patient voiced understanding.

## 2019-06-30 NOTE — Telephone Encounter (Signed)
Patient have some questions about her mediation

## 2019-06-30 NOTE — Telephone Encounter (Signed)
Attempted to contact patient with an appointment with Dr.Pratt for surgery consult. No answer, left voicemail with appointment infor 6/30 @ 9:35. Patient instructed to give the office a call back if needing to rescheduled. Reminder mailed.

## 2019-07-29 ENCOUNTER — Ambulatory Visit (INDEPENDENT_AMBULATORY_CARE_PROVIDER_SITE_OTHER): Payer: PRIVATE HEALTH INSURANCE | Admitting: Family Medicine

## 2019-07-29 ENCOUNTER — Other Ambulatory Visit: Payer: Self-pay

## 2019-07-29 ENCOUNTER — Encounter: Payer: Self-pay | Admitting: Family Medicine

## 2019-07-29 VITALS — BP 115/82 | HR 94 | Wt 185.4 lb

## 2019-07-29 DIAGNOSIS — Z23 Encounter for immunization: Secondary | ICD-10-CM | POA: Diagnosis not present

## 2019-07-29 DIAGNOSIS — N7093 Salpingitis and oophoritis, unspecified: Secondary | ICD-10-CM | POA: Diagnosis not present

## 2019-07-29 DIAGNOSIS — N971 Female infertility of tubal origin: Secondary | ICD-10-CM

## 2019-07-29 DIAGNOSIS — Z1331 Encounter for screening for depression: Secondary | ICD-10-CM | POA: Diagnosis not present

## 2019-07-29 NOTE — Progress Notes (Signed)
   Subjective:    Patient ID: Megan Ponce is a 39 y.o. female presenting with No chief complaint on file.  on 07/29/2019  HPI: Here today for f/u after 2nd hospitalization for bilateral pyosalpinges. Has remote h/o chlamydia. No pregnancies. Discussion of HSG for further tube eval, but she declines this. Would like surgery to reduce risk of infection and recurrent hospitalization. No prior surgeries.  Review of Systems  Constitutional: Negative for chills and fever.  Respiratory: Negative for shortness of breath.   Cardiovascular: Negative for chest pain.  Gastrointestinal: Negative for abdominal pain, nausea and vomiting.  Genitourinary: Negative for dysuria.  Skin: Negative for rash.      Objective:    BP 115/82   Pulse 94   Wt 185 lb 6.4 oz (84.1 kg)   LMP  (LMP Unknown)   BMI 31.82 kg/m  Physical Exam Constitutional:      General: She is not in acute distress.    Appearance: She is well-developed.  HENT:     Head: Normocephalic and atraumatic.  Eyes:     General: No scleral icterus. Cardiovascular:     Rate and Rhythm: Normal rate.  Pulmonary:     Effort: Pulmonary effort is normal.  Abdominal:     Palpations: Abdomen is soft.  Musculoskeletal:     Cervical back: Neck supple.  Skin:    General: Skin is warm and dry.  Neurological:     Mental Status: She is alert and oriented to person, place, and time.         Assessment & Plan:   Problem List Items Addressed This Visit      Unprioritized   Pyosalpinx    Recurrent with hospitalization x 2. Will book for removal of tubes. Would preserve ovaries. Risks include but are not limited to bleeding, infection, injury to surrounding structures, including bowel, bladder and ureters, blood clots, and death.  Likelihood of success is moderate. Discussed if there is a ton of adhesions, and cannot be removed safely, will abort procedure and consider robotic removal if needed.       Infertility, tubal origin     Can consider IVF--declines HSG       Other Visit Diagnoses    Need for tetanus, diphtheria, and acellular pertussis (Tdap) vaccine    -  Primary   Relevant Orders   Tdap vaccine greater than or equal to 7yo IM      Total time: 30 minutes.  Return in about 3 months (around 10/29/2019) for postop check, in person.  Reva Bores 07/29/2019 10:33 AM

## 2019-07-29 NOTE — Patient Instructions (Signed)
Diagnostic Laparoscopy Diagnostic laparoscopy is a procedure to diagnose diseases in the abdomen. It might be done for a variety of reasons, such as to look for scar tissue, cancer, or a reason for abdomen (abdominal) pain. During the procedure, a thin, flexible tube that has a light and a camera on the end (laparoscope) is inserted through an incision in the abdomen. The image from the camera is shown on a monitor to help your surgeon see inside your body. Tell a health care provider about:  Any allergies you have.  All medicines you are taking, including vitamins, herbs, eye drops, creams, and over-the-counter medicines.  Any problems you or family members have had with anesthetic medicines.  Any blood disorders you have.  Any surgeries you have had.  Any medical conditions you have. What are the risks? Generally, this is a safe procedure. However, problems may occur, including:  Infection.  Bleeding.  Allergic reactions to medicines or dyes.  Damage to abdominal structures or organs, such as the intestines, liver, stomach, or spleen. What happens before the procedure? Medicines  Ask your health care provider about: ? Changing or stopping your regular medicines. This is especially important if you are taking diabetes medicines or blood thinners. ? Taking medicines such as aspirin and ibuprofen. These medicines can thin your blood. Do not take these medicines unless your health care provider tells you to take them. ? Taking over-the-counter medicines, vitamins, herbs, and supplements.  You may be given antibiotic medicine to help prevent infection. Staying hydrated Follow instructions from your health care provider about hydration, which may include:  Up to 2 hours before the procedure - you may continue to drink clear liquids, such as water, clear fruit juice, black coffee, and plain tea. Eating and drinking restrictions Follow instructions from your health care provider  about eating and drinking, which may include:  8 hours before the procedure - stop eating heavy meals or foods such as meat, fried foods, or fatty foods.  6 hours before the procedure - stop eating light meals or foods, such as toast or cereal.  6 hours before the procedure - stop drinking milk or drinks that contain milk.  2 hours before the procedure - stop drinking clear liquids. General instructions  Ask your health care provider how your surgical site will be marked or identified.  You may be asked to shower with a germ-killing soap.  Plan to have someone take you home from the hospital or clinic.  Plan to have a responsible adult care for you for at least 24 hours after you leave the hospital or clinic. This is important. What happens during the procedure?   To lower your risk of infection: ? Your health care team will wash or sanitize their hands. ? Hair may be removed from the surgical area. ? Your skin will be washed with soap.  An IV will be inserted into one of your veins.  You will be given a medicine to make you fall asleep (general anesthetic). You may also be given a medicine to help you relax (sedative).  A breathing tube will be placed down your throat to help you breathe during the procedure.  Your abdomen will be filled with an air-like gas so it expands. This will give the surgeon more room to operate and will make your organs easier to see.  Many small incisions will be made in your abdomen.  A laparoscope and other surgical instruments will be inserted into your abdomen through the   incisions.  A tissue sample may be removed from an organ for examination (biopsy). This will depend on the reason why you are having this procedure.  The laparoscope and other instruments will be removed from your abdomen.  The gas will be released.  Your incisions will be closed with stitches (sutures) and covered with a bandage (dressing).  Your breathing tube will be  removed. The procedure may vary among health care providers and hospitals. What happens after the procedure?   Your blood pressure, heart rate, breathing rate, and blood oxygen level will be monitored until the medicines you were given have worn off.  Do not drive for 24 hours if you were given a sedative during your procedure.  It is up to you to get the results of your procedure. Ask your health care provider, or the department that is doing the procedure, when your results will be ready. Summary  Diagnostic laparoscopy is a way to look for problems in the abdomen using small incisions.  Follow instructions from your health care provider about how to prepare for the procedure.  Plan to have a responsible adult care for you for at least 24 hours after you leave the hospital or clinic. This is important. This information is not intended to replace advice given to you by your health care provider. Make sure you discuss any questions you have with your health care provider. Document Revised: 12/28/2016 Document Reviewed: 07/11/2016 Elsevier Patient Education  2020 [employer]  

## 2019-07-29 NOTE — Assessment & Plan Note (Signed)
Can consider IVF--declines HSG

## 2019-07-29 NOTE — Assessment & Plan Note (Signed)
Recurrent with hospitalization x 2. Will book for removal of tubes. Would preserve ovaries. Risks include but are not limited to bleeding, infection, injury to surrounding structures, including bowel, bladder and ureters, blood clots, and death.  Likelihood of success is moderate. Discussed if there is a ton of adhesions, and cannot be removed safely, will abort procedure and consider robotic removal if needed.

## 2019-08-13 NOTE — BH Specialist Note (Deleted)
Integrated Behavioral Health via Telemedicine Video (Caregility) Visit  08/13/2019 Megan Ponce [id number]  Number of Integrated Behavioral Health visits: 1 Session Start time: 3:45***  Session End time: 4:15*** Total time: {IBH Total XAJO:87867672}  Referring Provider: Tinnie Gens, MD Type of Visit: Video Patient/Family location: Home [name] Provider location: [name]  All persons participating in visit: Patient Megan Ponce and [name] [name] ***   Confirmed patient's address: Yes  Confirmed patient's phone number: Yes  Any changes to demographics: No   Confirmed patient's insurance: Yes  Any changes to patient's insurance: No   Discussed confidentiality: Yes ***  I connected with Megan Ponce  by a video enabled telemedicine application (Caregility) and verified that I am speaking with the correct person using two identifiers.     I discussed the limitations of evaluation and management by telemedicine and the availability of in person appointments.  I discussed that the purpose of this visit is to provide behavioral health care while limiting exposure to the novel coronavirus.   Discussed there is a possibility of technology failure and discussed alternative modes of communication if that failure occurs.  I discussed that engaging in this virtual visit, they consent to the provision of behavioral healthcare and the services will be billed under their insurance.  Patient and/or legal guardian expressed understanding and consented to virtual visit: Yes   PRESENTING CONCERNS: Patient and/or family reports the following symptoms/concerns: *** Duration of problem: ***; Severity of problem: {Mild/Moderate/Severe:20260}  STRENGTHS (Protective Factors/Coping Skills): ***  GOALS ADDRESSED: Patient will: 1.  Reduce symptoms of: {IBH Symptoms:21014056}  2.  Increase knowledge and/or ability of: {IBH Patient  Tools:21014057}  3.  Demonstrate ability to: {IBH Goals:21014053}  INTERVENTIONS: Interventions utilized:  {IBH Interventions:21014054} Standardized Assessments completed: {IBH Screening Tools:21014051}  ASSESSMENT: Patient currently experiencing ***.   Patient may benefit from psychoeducation and brief therapeutic interventions regarding coping with symptoms of *** .  PLAN: 1. Follow up with behavioral health clinician on : *** 2. Behavioral recommendations:  -*** -*** 3. Referral(s): {IBH Referrals:21014055}  I discussed the assessment and treatment plan with the patient and/or parent/guardian. They were provided an opportunity to ask questions and all were answered. They agreed with the plan and demonstrated an understanding of the instructions.   They were advised to call back or seek an in-person evaluation if the symptoms worsen or if the condition fails to improve as anticipated.  [name]  Depression screen [facility] 2/9 07/29/2019  Decreased Interest 1  Down, Depressed, Hopeless 1  PHQ - 2 Score 2  Altered sleeping 2  Tired, decreased energy 3  Change in appetite 0  Feeling bad or failure about yourself  1  Trouble concentrating 1  Moving slowly or fidgety/restless 0  Suicidal thoughts 0  PHQ-9 Score 9   GAD 7 : Generalized Anxiety Score 07/29/2019  Nervous, Anxious, on Edge 0  Control/stop worrying 0  Worry too much - different things 1  Trouble relaxing 1  Restless 0  Easily annoyed or irritable 0  Afraid - awful might happen 0  Total GAD 7 Score 2    ***

## 2019-08-19 NOTE — BH Specialist Note (Signed)
Integrated Behavioral Health via Telemedicine Video (Caregility) Visit  08/19/2019 Megan Ponce [id number]  Number of Integrated Behavioral Health visits: 1 Session Start time: 4:03  Session End time: 4:48 Total time: 45   Referring Provider: Tinnie Gens, MD Type of Visit: Video Patient/Family location: Home [facility] Provider location: [facility]  All persons participating in visit: Patient Megan Ponce and Megan Ponce    Confirmed patient's address: Yes  Confirmed patient's phone number: Yes  Any changes to demographics: No   Confirmed patient's insurance: Yes  Any changes to patient's insurance: No   Discussed confidentiality: Yes   I connected with [name]  by a video enabled telemedicine application (Caregility) and verified that I am speaking with the correct person using two identifiers.     I discussed the limitations of evaluation and management by telemedicine and the availability of in person appointments.  I discussed that the purpose of this visit is to provide behavioral health care while limiting exposure to the novel coronavirus.   Discussed there is a possibility of technology failure and discussed alternative modes of communication if that failure occurs.  I discussed that engaging in this virtual visit, they consent to the provision of behavioral healthcare and the services will be billed under their insurance.  Patient and/or legal guardian expressed understanding and consented to virtual visit: Yes   PRESENTING CONCERNS: Patient and/or family reports the following symptoms/concerns: Pt states her primary concern today is conflicting feelings over upcoming surgery; Passive SI with no intent and no plan; helps to talk through feelings today. Duration of problem: Since scheduling surgery; Severity of problem: moderate  STRENGTHS (Protective Factors/Coping Skills): Strong social support via  friends and family; Faith/belief  GOALS ADDRESSED: Patient will: 1.  Reduce symptoms of: anxiety, depression and stress  2.  Increase knowledge and/or ability of: self-management skills  3.  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   Patient may benefit from psychoeducation and brief therapeutic interventions regarding coping with symptoms of anxiety and depression .  PLAN: 1. Follow up with behavioral health clinician on : One month 2. Behavioral recommendations:  -Follow safety plan if SI returns -Consider Worry Time strategy, to prioritize life worries/stressors -Consider apps (on After Visit Summary) for additional self-care  3. Referral(s): Integrated [employer] (In Clinic)  I discussed the assessment and treatment plan with the patient and/or parent/guardian. They were provided an opportunity to ask questions and all were answered. They agreed with the plan and demonstrated an understanding of the instructions.   They were advised to call back or seek an in-person evaluation if the symptoms worsen or if the condition fails to improve as anticipated.  Valetta Close Megan Ponce

## 2019-08-27 ENCOUNTER — Ambulatory Visit (INDEPENDENT_AMBULATORY_CARE_PROVIDER_SITE_OTHER): Payer: Self-pay | Admitting: Clinical

## 2019-08-27 DIAGNOSIS — Z1331 Encounter for screening for depression: Secondary | ICD-10-CM

## 2019-08-27 NOTE — Patient Instructions (Signed)
[facility] [address] [place], [place] [address] [phone] (main office) [phone] (Dody Smartt's office)  Behavioral Health Resources:   What if I or someone I know is in crisis?  . If you are thinking about harming yourself or having thoughts of suicide, or if you know someone who is, seek help right away.  . Call your doctor or mental health care provider.  . Call 911 or go to a hospital emergency room to get immediate help, or ask a friend or family member to help you do these things.  . Call the [place] National Suicide Prevention Lifeline's toll-free, 24-hour hotline at 1-800-273-TALK (1-800-273-8255) or TTY: 1-800-799-4 TTY (1-800-799-4889) to talk to a trained counselor.  . If you are in crisis, make sure you are not left alone.   . If someone else is in crisis, make sure he or she is not left alone   24 Hour :   [place] National Suicide Hotline: 1-800-273-8255  Therapeutic Alternative Mobile Crisis: 1-877-626-1772   [facility]  [address], [place], [place] [address]  800-711-2635 or 336-832-9700  [facility] Crisis Line (Domestic Violence, Rape & Victim Assistance)  336-273-7273  [facility]  [address] [place], [place]  [address]   1-855-788-8787 or 336-676-6840   [facility]: 336-899-1505 (8am-4pm) or 1-866- 261-5769 (after hours)           [facility], [address], [place], [place]  1-800-711-2635 Fax: 336-832-9701 [email or web address]  *Interpreters available *Accepts Medicaid, Medicare, uninsured  [facility]   Mon-Fri: 8am-5pm [address], [place], [place] [phone](phone); [phone](fax) www.[email or web address]  *Accepts Medicare  [facility] Mon, Tues, Thurs, Fri:  8am-4pm [address], [place], [place]  [phone] (phone); [phone] (fax) www.[email or web address]  *Accepts Medicare  [facility] Mon-Fri: 9am-5pm  [address], [place], [place] [phone] (phone); [phone]  www.[email or web address]  *Accepts Medicaid  [facility] [address], [place], [place]  [phone]  [email or web address]://[email or web address]   [facility] Mon-Fri, 8:30am-12pm/1pm-2:30pm [address], [place], [place] [phone] (phone); [phone] (fax) www.[email or web address]  *Accepts Medicaid, sliding-scale*Bilingual services available  [facility] Mon-Fri, 8am-7pm [address], [place], [place]  [phone](phone); [phone](fax) www.[email or web address]  *Accepts Medicaid *Bilingual services available  [facility] Mon-Fri: 8am-5pm, Saturday by appointment only [address], [place], [place] [phone] (phone); [phone] (fax) www.[email or web address]   [facility] [address], [place], [place] [phone] www.[email or web address]  *Free & reduced services for uninsured and underinsured individuals *Bilingual services for Spanish-speaking clients 21 and under  [facility], [address], [place],  [place] [phone](phone); [phone](fax) www.[email or web address]  *Bring your own interpreter at first visit *Accepts Medicare and Medicaid  [facility] Mon-Fri: 9am-5:30pm [address], [place], [place] [phone] (phone), [phone] (fax) After hours crisis line: 336-763-1165 www.[email or web address]  *Accepts Medicare and Medicaid  [facility] Mon-Thurs, 8am-6pm [address] [place] [address], [place], [place]  [phone] (phone); [phone]  (fax) http://[email or web address]  *Subsidized costs available  [facility] Mon-Fri: 8am-4pm [address], [place], [place] [phone](phone); [phone](fax) www.[email or web address]  *Accepts Medicaid  [facility] Same day access hours: Mon-Fri, 8:30-3pm Crisis hours: Mon-Fri, 8am-5pm [address], [place], [place]  [facility] Same day access hours: Mon-Fri, 8:30-3pm Crisis hours: Mon-Fri, 8am-8pm [address], , [place] [phone] (phone);   [phone] (fax) www.[email or web address]  *Accepts Medicaid and Medicare  [facility] Mon, Wed, Fri: 9am-9pm Tues, Thurs: 9am-6pm [address], [place], [place]  [phone] (phone); [phone] (fax) [email or web address]://[email or web address]  *(Accepts Medicare and Medicaid; payment plans available)*Bilingual services available  [facility] [address], [place], [place] [phone] (phone); [phone] (fax) www.[email or web address]   [facility] [address], [place], [place]  [phone]  www.[email or web address]  *Bilingual services available  [facility] Mon-Thurs: 8am-8pm [address], [place], [place] [phone] (phone); [phone] (fax) [email or web address]://[email or web address]  *Accepts Medicaid*Bilingual services available  [facility] [address], [place], [place] [phone] (phone) [email or web address]://[email or web address]  *Accepts Medicaid *Bilingual services available  [facility] Mon-Fri, 9am-4:45pm [address], [place], [place] [phone] (phone); [phone] (fax) http://[email or web address]  *Accepts Medicare  [facility] Mon-Thurs: 8:30-8pm, Fri: 8:30am-7pm [address], [place], [place] (3rd floor) [phone] (phone); [phone]  (fax) [email or web address]://[email or web address]  *Accepts Medicaid; income-based reduced rates available  [facility] Mon-Fri: 8am-5pm [address], [place], [place] [phone] (phone); [phone] (fax) http://[email or web address]  *Accepts Medicaid*Bilingual services available  [facility] [address], [place],   [phone] (phone); [phone] (fax) www.[email or web address]  *Free emergency housing and clinical services for youth in crisis  [facility]  [address], [place] [phone] www.mhag.org  *Provides direct services to individuals in recovery from mental illness, including support groups, recovery skills classes, and one on one peer support  NAMI (National Alliance on Mental Illness) [place] NAMI helpline: 336-370-4264  https://namiguilford.org  *A community hub for information relating to local resources and services for the friends and families of individuals living alongside a mental health condition, as well as the individuals themselves. Classes and support groups also provided   /Emotional Wellbeing Apps and Websites Here are a few free apps meant to help you to help yourself.  To find, try searching on the internet to see if the app is offered on Apple/Android devices. If your first choice doesn't come up on your device, the good news is that there are many choices! Play around with different apps to see which ones are helpful to you.    Calm This is an app meant to help increase calm feelings. Includes info, strategies, and tools for tracking your feelings.      Calm Harm  This app is meant to help with self-harm. Provides many 5-minute or 15-min coping strategies for doing instead of hurting yourself.       Healthy Minds Health Minds is a problem-solving tool to help deal with emotions and cope with stress you encounter wherever you are.      MindShift This  app can help people cope with anxiety. Rather than trying to avoid anxiety, you can make an important shift and face it.      MY3  MY3 features a support system, safety plan and resources with the goal of offering a tool to use in a time of need.       My Life My Voice  This mood journal offers a simple solution for tracking your thoughts, feelings and moods. Animated emoticons can help identify your mood.       Relax Melodies Designed to help with sleep, on this app you can mix sounds and meditations for relaxation.      Smiling Mind Smiling Mind is meditation made easy: it's a simple tool that helps put a smile on your mind.          Stop, Breathe & Think  A friendly, simple guide for people through meditations for mindfulness and compassion.  Stop, Breathe and Think Kids Enter your current feelings and choose a "mission" to help you cope. Offers videos for certain moods instead of just sound recordings.       Team Orange The goal of this tool is to help teens change how they think, act, and react. This app helps you focus on your own good feelings and experiences.      The Virtual Hope Box The Virtual Hope Box (VHB) contains simple tools to help patients with coping, relaxation, distraction, and positive thinking.      

## 2019-08-28 NOTE — H&P (Signed)
Megan Ponce is an 39 y.o. G0P0000 female.   Chief Complaint: pelvic pain HPI: Patient has a history of 2 previous hospitalizations for bilateral pyelosalpinges.  Has remote history of chlamydia.  No prior pregnancies.  She desires definitive treatment for bilateral pyelosalpinges to reduce the risk of recurrent infection and hospitalization.  No past medical history on file.  Past Surgical History:  Procedure Laterality Date  . NO PAST SURGERIES      Family History  Problem Relation Age of Onset  . Heart disease Father   . Multiple sclerosis Mother   . Breast cancer Mother 22   Social History:  reports that she has been smoking cigarettes. She has never used smokeless tobacco. She reports that she does not drink alcohol and does not use drugs.  Allergies: No Known Allergies  No medications prior to admission.    A comprehensive review of systems was negative.  There were no vitals taken for this visit. General appearance: alert, cooperative and appears stated age Head: Normocephalic, without obvious abnormality, atraumatic Neck: supple, symmetrical, trachea midline Lungs: Normal effort Heart: regular rate and rhythm Abdomen: soft, non-tender; bowel sounds normal; no masses,  no organomegaly Extremities: extremities normal, atraumatic, no cyanosis or edema Skin: Skin color, texture, turgor normal. No rashes or lesions Neurologic: Grossly normal   Lab Results  Component Value Date   WBC 12.6 (H) 06/28/2019   HGB 12.8 06/28/2019   HCT 38.3 06/28/2019   MCV 88.5 06/28/2019   PLT 314 06/28/2019   Lab Results  Component Value Date   PREGTESTUR NEGATIVE 05/23/2014   HCG <5.0 06/25/2019     Assessment/Plan Principal Problem:   Hydrosalpinx Active Problems:   Pyosalpinx  For bilateral salpingectomy.  This will render her sterile.  She is aware of this, and still desires definitive treatment. She will consider IVF if needed for pregnancy. Risks include but are  not limited to bleeding, infection, injury to surrounding structures, including bowel, bladder and ureters, blood clots, and death.  Likelihood of success is high.    Megan Ponce 08/28/2019, 11:16 PM

## 2019-09-04 ENCOUNTER — Other Ambulatory Visit (HOSPITAL_COMMUNITY): Payer: PRIVATE HEALTH INSURANCE

## 2019-09-22 NOTE — BH Specialist Note (Signed)
Pt did not arrive to video visit and did not answer the phone ; Left HIPPA-compliant message to call back Megan Ponce from [facility] at [phone] (main office) or [phone] (Adetokunbo Mccadden's office).  ; left MyChart message for patient.      

## 2019-09-24 ENCOUNTER — Ambulatory Visit: Payer: Self-pay | Admitting: Clinical

## 2019-09-24 ENCOUNTER — Other Ambulatory Visit: Payer: Self-pay

## 2019-09-24 DIAGNOSIS — Z91199 Patient's noncompliance with other medical treatment and regimen due to unspecified reason: Secondary | ICD-10-CM

## 2019-10-22 ENCOUNTER — Encounter (HOSPITAL_BASED_OUTPATIENT_CLINIC_OR_DEPARTMENT_OTHER): Payer: Self-pay | Admitting: Family Medicine

## 2019-10-22 ENCOUNTER — Other Ambulatory Visit: Payer: Self-pay

## 2019-10-24 ENCOUNTER — Other Ambulatory Visit (HOSPITAL_COMMUNITY): Payer: PRIVATE HEALTH INSURANCE

## 2019-10-26 ENCOUNTER — Encounter (HOSPITAL_BASED_OUTPATIENT_CLINIC_OR_DEPARTMENT_OTHER)
Admission: RE | Admit: 2019-10-26 | Discharge: 2019-10-26 | Disposition: A | Discharge status: Home / Self Care | Payer: PRIVATE HEALTH INSURANCE | Source: Ambulatory Visit | Attending: Family Medicine | Admitting: Family Medicine

## 2019-10-26 ENCOUNTER — Other Ambulatory Visit (HOSPITAL_COMMUNITY)
Admission: RE | Admit: 2019-10-26 | Discharge: 2019-10-26 | Disposition: A | Discharge status: Home / Self Care | Payer: PRIVATE HEALTH INSURANCE | Source: Ambulatory Visit

## 2019-10-26 DIAGNOSIS — Z01812 Encounter for preprocedural laboratory examination: Secondary | ICD-10-CM | POA: Insufficient documentation

## 2019-10-26 DIAGNOSIS — Z20822 Contact with and (suspected) exposure to covid-19: Secondary | ICD-10-CM | POA: Insufficient documentation

## 2019-10-26 LAB — CBC
HCT: 46.1 % — ABNORMAL HIGH (ref 36.0–46.0)
Hemoglobin: 15.2 g/dL — ABNORMAL HIGH (ref 12.0–15.0)
MCH: 29.2 pg (ref 26.0–34.0)
MCHC: 33 g/dL (ref 30.0–36.0)
MCV: 88.7 fL (ref 80.0–100.0)
Platelets: 332 10*3/uL (ref 150–400)
RBC: 5.2 MIL/uL — ABNORMAL HIGH (ref 3.87–5.11)
RDW: 12.7 % (ref 11.5–15.5)
WBC: 7.3 10*3/uL (ref 4.0–10.5)
nRBC: 0 % (ref 0.0–0.2)

## 2019-10-26 LAB — POCT PREGNANCY, URINE: Preg Test, Ur: NEGATIVE

## 2019-10-26 LAB — SARS CORONAVIRUS 2 (TAT 6-24 HRS): SARS Coronavirus 2: NEGATIVE

## 2019-10-26 NOTE — Progress Notes (Signed)

## 2019-10-28 ENCOUNTER — Ambulatory Visit (HOSPITAL_BASED_OUTPATIENT_CLINIC_OR_DEPARTMENT_OTHER): Payer: PRIVATE HEALTH INSURANCE | Admitting: Certified Registered"

## 2019-10-28 ENCOUNTER — Ambulatory Visit (HOSPITAL_BASED_OUTPATIENT_CLINIC_OR_DEPARTMENT_OTHER)
Admission: RE | Admit: 2019-10-28 | Discharge: 2019-10-28 | Disposition: A | Discharge status: Home / Self Care | Payer: PRIVATE HEALTH INSURANCE | Attending: Family Medicine | Admitting: Family Medicine

## 2019-10-28 ENCOUNTER — Other Ambulatory Visit: Payer: Self-pay

## 2019-10-28 ENCOUNTER — Encounter (HOSPITAL_BASED_OUTPATIENT_CLINIC_OR_DEPARTMENT_OTHER): Payer: Self-pay | Admitting: Family Medicine

## 2019-10-28 ENCOUNTER — Encounter (HOSPITAL_BASED_OUTPATIENT_CLINIC_OR_DEPARTMENT_OTHER)
Admission: RE | Disposition: A | Discharge status: Home / Self Care | Payer: Self-pay | Source: Home / Self Care | Attending: Family Medicine

## 2019-10-28 DIAGNOSIS — N739 Female pelvic inflammatory disease, unspecified: Secondary | ICD-10-CM | POA: Insufficient documentation

## 2019-10-28 DIAGNOSIS — F17210 Nicotine dependence, cigarettes, uncomplicated: Secondary | ICD-10-CM | POA: Diagnosis not present

## 2019-10-28 DIAGNOSIS — N7093 Salpingitis and oophoritis, unspecified: Secondary | ICD-10-CM | POA: Diagnosis present

## 2019-10-28 DIAGNOSIS — N7011 Chronic salpingitis: Secondary | ICD-10-CM | POA: Diagnosis present

## 2019-10-28 HISTORY — PX: LAPAROSCOPIC BILATERAL SALPINGECTOMY: SHX5889

## 2019-10-28 LAB — POCT PREGNANCY, URINE: Preg Test, Ur: NEGATIVE

## 2019-10-28 SURGERY — SALPINGECTOMY, BILATERAL, LAPAROSCOPIC
Anesthesia: General | Site: Abdomen | Laterality: Bilateral

## 2019-10-28 MED ORDER — BUPIVACAINE HCL (PF) 0.25 % IJ SOLN
INTRAMUSCULAR | Status: DC | PRN
  Administered 2019-10-28: 22 mL

## 2019-10-28 MED ORDER — MEPERIDINE HCL 25 MG/ML IJ SOLN: 6.2500 mg | INTRAMUSCULAR | Status: DC | PRN

## 2019-10-28 MED ORDER — PROPOFOL 10 MG/ML IV BOLUS
INTRAVENOUS | Status: AC
  Filled 2019-10-28: qty 20

## 2019-10-28 MED ORDER — ACETAMINOPHEN 325 MG PO TABS: 325.0000 mg | ORAL_TABLET | ORAL | Status: DC | PRN

## 2019-10-28 MED ORDER — ONDANSETRON HCL 4 MG/2ML IJ SOLN: 4.0000 mg | Freq: Once | INTRAMUSCULAR | Status: DC | PRN

## 2019-10-28 MED ORDER — FENTANYL CITRATE (PF) 100 MCG/2ML IJ SOLN
INTRAMUSCULAR | Status: AC
  Filled 2019-10-28: qty 2

## 2019-10-28 MED ORDER — FENTANYL CITRATE (PF) 100 MCG/2ML IJ SOLN
INTRAMUSCULAR | Status: DC | PRN
Start: 2019-10-28 — End: 2019-10-28
  Administered 2019-10-28 (×4): 50 ug via INTRAVENOUS

## 2019-10-28 MED ORDER — OXYCODONE HCL 5 MG/5ML PO SOLN: 5.0000 mg | Freq: Once | ORAL | Status: AC | PRN

## 2019-10-28 MED ORDER — LACTATED RINGERS IV SOLN: INTRAVENOUS | Status: DC

## 2019-10-28 MED ORDER — GABAPENTIN 300 MG PO CAPS
300.0000 mg | ORAL_CAPSULE | ORAL | Status: AC
  Administered 2019-10-28: 300 mg via ORAL

## 2019-10-28 MED ORDER — POVIDONE-IODINE 10 % EX SWAB
2.0000 "application " | Freq: Once | CUTANEOUS | Status: AC
  Administered 2019-10-28: 2 via TOPICAL

## 2019-10-28 MED ORDER — ROCURONIUM BROMIDE 100 MG/10ML IV SOLN
INTRAVENOUS | Status: DC | PRN
  Administered 2019-10-28: 60 mg via INTRAVENOUS

## 2019-10-28 MED ORDER — DEXAMETHASONE SODIUM PHOSPHATE 4 MG/ML IJ SOLN
INTRAMUSCULAR | Status: DC | PRN
  Administered 2019-10-28: 5 mg via INTRAVENOUS

## 2019-10-28 MED ORDER — OXYCODONE HCL 5 MG PO TABS
ORAL_TABLET | ORAL | Status: AC
  Filled 2019-10-28: qty 1

## 2019-10-28 MED ORDER — OXYCODONE HCL 5 MG PO TABS
5.0000 mg | ORAL_TABLET | Freq: Once | ORAL | Status: AC | PRN
  Administered 2019-10-28: 5 mg via ORAL

## 2019-10-28 MED ORDER — OXYCODONE HCL 5 MG PO TABS: 5.0000 mg | ORAL_TABLET | Freq: Four times a day (QID) | ORAL | 0 refills | Status: DC | PRN

## 2019-10-28 MED ORDER — PROPOFOL 500 MG/50ML IV EMUL
INTRAVENOUS | Status: DC | PRN
  Administered 2019-10-28: 25 ug/kg/min via INTRAVENOUS

## 2019-10-28 MED ORDER — GABAPENTIN 300 MG PO CAPS
ORAL_CAPSULE | ORAL | Status: AC
  Filled 2019-10-28: qty 1

## 2019-10-28 MED ORDER — ROCURONIUM BROMIDE 10 MG/ML (PF) SYRINGE
PREFILLED_SYRINGE | INTRAVENOUS | Status: AC
  Filled 2019-10-28: qty 10

## 2019-10-28 MED ORDER — SODIUM CHLORIDE 0.9 % IV SOLN
2.0000 g | INTRAVENOUS | Status: AC
  Administered 2019-10-28: 2 g via INTRAVENOUS

## 2019-10-28 MED ORDER — FENTANYL CITRATE (PF) 100 MCG/2ML IJ SOLN: 25.0000 ug | INTRAMUSCULAR | Status: DC | PRN

## 2019-10-28 MED ORDER — ACETAMINOPHEN 500 MG PO TABS
1000.0000 mg | ORAL_TABLET | ORAL | Status: AC
  Administered 2019-10-28: 1000 mg via ORAL

## 2019-10-28 MED ORDER — SODIUM CHLORIDE 0.9 % IV SOLN
INTRAVENOUS | Status: AC
  Filled 2019-10-28: qty 2

## 2019-10-28 MED ORDER — PROPOFOL 10 MG/ML IV BOLUS
INTRAVENOUS | Status: DC | PRN
  Administered 2019-10-28: 150 mg via INTRAVENOUS

## 2019-10-28 MED ORDER — LIDOCAINE HCL (CARDIAC) PF 100 MG/5ML IV SOSY
PREFILLED_SYRINGE | INTRAVENOUS | Status: DC | PRN
  Administered 2019-10-28: 100 mg via INTRAVENOUS

## 2019-10-28 MED ORDER — ACETAMINOPHEN 160 MG/5ML PO SOLN: 325.0000 mg | ORAL | Status: DC | PRN

## 2019-10-28 MED ORDER — ACETAMINOPHEN 500 MG PO TABS
ORAL_TABLET | ORAL | Status: AC
  Filled 2019-10-28: qty 2

## 2019-10-28 MED ORDER — MIDAZOLAM HCL 5 MG/5ML IJ SOLN
INTRAMUSCULAR | Status: DC | PRN
  Administered 2019-10-28: 2 mg via INTRAVENOUS

## 2019-10-28 MED ORDER — MIDAZOLAM HCL 2 MG/2ML IJ SOLN
INTRAMUSCULAR | Status: AC
  Filled 2019-10-28: qty 2

## 2019-10-28 MED ORDER — ONDANSETRON HCL 4 MG/2ML IJ SOLN
INTRAMUSCULAR | Status: DC | PRN
  Administered 2019-10-28: 4 mg via INTRAVENOUS

## 2019-10-28 MED ORDER — EPHEDRINE SULFATE 50 MG/ML IJ SOLN
INTRAMUSCULAR | Status: DC | PRN
  Administered 2019-10-28: 10 mg via INTRAVENOUS

## 2019-10-28 MED ORDER — SUGAMMADEX SODIUM 200 MG/2ML IV SOLN
INTRAVENOUS | Status: DC | PRN
  Administered 2019-10-28: 200 mg via INTRAVENOUS

## 2019-10-28 SURGICAL SUPPLY — 40 items
BAG SPEC RTRVL LRG 6X4 10 (ENDOMECHANICALS)
BLADE SURG 15 STRL LF DISP TIS (BLADE) ×1 IMPLANT
BLADE SURG 15 STRL SS (BLADE) ×3
CABLE HIGH FREQUENCY MONO STRZ (ELECTRODE) IMPLANT
CATH ROBINSON RED A/P 16FR (CATHETERS) IMPLANT
DRSG OPSITE POSTOP 3X4 (GAUZE/BANDAGES/DRESSINGS) IMPLANT
DURAPREP 26ML APPLICATOR (WOUND CARE) ×3 IMPLANT
GAUZE 4X4 16PLY RFD (DISPOSABLE) ×3 IMPLANT
GLOVE BIO SURGEON STRL SZ 6.5 (GLOVE) ×2 IMPLANT
GLOVE BIO SURGEONS STRL SZ 6.5 (GLOVE) ×1
GLOVE BIOGEL PI IND STRL 6.5 (GLOVE) ×2 IMPLANT
GLOVE BIOGEL PI IND STRL 7.0 (GLOVE) ×4 IMPLANT
GLOVE BIOGEL PI INDICATOR 6.5 (GLOVE) ×4
GLOVE BIOGEL PI INDICATOR 7.0 (GLOVE) ×8
GLOVE ECLIPSE 6.5 STRL STRAW (GLOVE) ×3 IMPLANT
GLOVE ECLIPSE 7.0 STRL STRAW (GLOVE) ×3 IMPLANT
GOWN STRL REUS W/TWL LRG LVL3 (GOWN DISPOSABLE) ×9 IMPLANT
NS IRRIG 1000ML POUR BTL (IV SOLUTION) ×6 IMPLANT
PACK LAPAROSCOPY BASIN (CUSTOM PROCEDURE TRAY) ×3 IMPLANT
PACK TRENDGUARD 450 HYBRID PRO (MISCELLANEOUS) ×1 IMPLANT
PACK TRENDGUARD 600 HYBRD PROC (MISCELLANEOUS) IMPLANT
PAD OB MATERNITY 4.3X12.25 (PERSONAL CARE ITEMS) ×3 IMPLANT
PAD PREP 24X48 CUFFED NSTRL (MISCELLANEOUS) ×3 IMPLANT
POUCH SPECIMEN RETRIEVAL 10MM (ENDOMECHANICALS) IMPLANT
SET IRRIG TUBING LAPAROSCOPIC (IRRIGATION / IRRIGATOR) IMPLANT
SET TUBE SMOKE EVAC HIGH FLOW (TUBING) ×3 IMPLANT
SHEARS HARMONIC ACE PLUS 36CM (ENDOMECHANICALS) ×3 IMPLANT
SLEEVE ENDOPATH XCEL 5M (ENDOMECHANICALS) ×3 IMPLANT
SLEEVE SCD COMPRESS KNEE MED (MISCELLANEOUS) ×3 IMPLANT
SOL ANTI FOG 6CC (MISCELLANEOUS) ×1 IMPLANT
SOLUTION ANTI FOG 6CC (MISCELLANEOUS) ×2
SUT VIC AB 3-0 X1 27 (SUTURE) ×3 IMPLANT
SUT VICRYL 0 UR6 27IN ABS (SUTURE) ×6 IMPLANT
TOWEL GREEN STERILE FF (TOWEL DISPOSABLE) ×6 IMPLANT
TRAY FOLEY W/BAG SLVR 14FR LF (SET/KITS/TRAYS/PACK) ×3 IMPLANT
TRENDGUARD 450 HYBRID PRO PACK (MISCELLANEOUS) ×3
TRENDGUARD 600 HYBRID PROC PK (MISCELLANEOUS)
TROCAR BALLN 12MMX100 BLUNT (TROCAR) ×3 IMPLANT
TROCAR XCEL NON-BLD 5MMX100MML (ENDOMECHANICALS) ×3 IMPLANT
WARMER LAPAROSCOPE (MISCELLANEOUS) ×3 IMPLANT

## 2019-10-28 NOTE — Discharge Instructions (Signed)
Took oxycodone at 4pm. May repeat at 10p tonight if needed.    Laparoscopic Tubal Ligation, Care After This sheet gives you information about how to care for yourself after your procedure. Your health care provider may also give you more specific instructions. If you have problems or questions, contact your health care provider. What can I expect after the procedure? After the procedure, it is common to have:  A sore throat.  Discomfort in your shoulder.  Mild discomfort or cramping in your abdomen.  Gas pains.  Pain or soreness in the area where the surgical incision was made.  A bloated feeling.  Tiredness.  Nausea.  Vomiting. Follow these instructions at home: Medicines  Take over-the-counter and prescription medicines only as told by your health care provider.  Do not take aspirin because it can cause bleeding.  Ask your health care provider if the medicine prescribed to you: ? Requires you to avoid driving or using heavy machinery. ? Can cause constipation. You may need to take actions to prevent or treat constipation, such as:  Drink enough fluid to keep your urine pale yellow.  Take over-the-counter or prescription medicines.  Eat foods that are high in fiber, such as beans, whole grains, and fresh fruits and vegetables.  Limit foods that are high in fat and processed sugars, such as fried or sweet foods. Incision care      Follow instructions from your health care provider about how to take care of your incision. Make sure you: ? Wash your hands with soap and water before and after you change your bandage (dressing). If soap and water are not available, use hand sanitizer. ? Change your dressing as told by your health care provider. ? Leave stitches (sutures), skin glue, or adhesive strips in place. These skin closures may need to stay in place for 2 weeks or longer. If adhesive strip edges start to loosen and curl up, you may trim the loose edges. Do not  remove adhesive strips completely unless your health care provider tells you to do that.  Check your incision area every day for signs of infection. Check for: ? Redness, swelling, or pain. ? Fluid or blood. ? Warmth. ? Pus or a bad smell. Activity  Rest as told by your health care provider.  Avoid sitting for a long time without moving. Get up to take short walks every 1-2 hours. This is important to improve blood flow and breathing. Ask for help if you feel weak or unsteady.  Return to your normal activities as told by your health care provider. Ask your health care provider what activities are safe for you. General instructions  Do not take baths, swim, or use a hot tub until your health care provider approves. Ask your health care provider if you may take showers. You may only be allowed to take sponge baths.  Have someone help you with your daily household tasks for the first few days.  Keep all follow-up visits as told by your health care provider. This is important. Contact a health care provider if:  You have redness, swelling, or pain around your incision.  Your incision feels warm to the touch.  You have pus or a bad smell coming from your incision.  The edges of your incision break open after the sutures have been removed.  Your pain does not improve after 2-3 days.  You have a rash.  You repeatedly become dizzy or light-headed.  Your pain medicine is not helping. Get  help right away if you:  Have a fever.  Faint.  Have increasing pain in your abdomen.  Have severe pain in one or both of your shoulders.  Have fluid or blood coming from your sutures or from your vagina.  Have shortness of breath or difficulty breathing.  Have chest pain or leg pain.  Have ongoing nausea, vomiting, or diarrhea. Summary  After the procedure, it is common to have mild discomfort or cramping in your abdomen.  Take over-the-counter and prescription medicines only as  told by your health care provider.  Watch for symptoms that should prompt you to call your health care provider.  Keep all follow-up visits as told by your health care provider. This is important. This information is not intended to replace advice given to you by your health care provider. Make sure you discuss any questions you have with your health care provider. Document Revised: 06/24/2018 Document Reviewed: 12/10/2017 Elsevier Patient Education  2020 [employer].   Post Anesthesia Home Care Instructions  Activity: Get plenty of rest for the remainder of the day. A responsible individual must stay with you for 24 hours following the procedure.  For the next 24 hours, DO NOT: -Drive a car -Advertising copywriter -Drink alcoholic beverages -Take any medication unless instructed by your physician -Make any legal decisions or sign important papers.  Meals: Start with liquid foods such as gelatin or soup. Progress to regular foods as tolerated. Avoid greasy, spicy, heavy foods. If nausea and/or vomiting occur, drink only clear liquids until the nausea and/or vomiting subsides. Call your physician if vomiting continues.  Special Instructions/Symptoms: Your throat may feel dry or sore from the anesthesia or the breathing tube placed in your throat during surgery. If this causes discomfort, gargle with warm salt water. The discomfort should disappear within 24 hours.  If you had a scopolamine patch placed behind your ear for the management of post- operative nausea and/or vomiting:  1. The medication in the patch is effective for 72 hours, after which it should be removed.  Wrap patch in a tissue and discard in the trash. Wash hands thoroughly with soap and water. 2. You may remove the patch earlier than 72 hours if you experience unpleasant side effects which may include dry mouth, dizziness or visual disturbances. 3. Avoid touching the patch. Wash your hands with soap and water after  contact with the patch.

## 2019-10-28 NOTE — Transfer of Care (Signed)
Immediate Anesthesia Transfer of Care Note  Patient: Megan Ponce  Procedure(s) Performed: LAPAROSCOPIC BILATERAL SALPINGECTOMY (Bilateral Abdomen)  Patient Location: PACU  Anesthesia Type:General  Level of Consciousness: drowsy, patient cooperative and responds to stimulation  Airway & Oxygen Therapy: Patient Spontanous Breathing and Patient connected to face mask oxygen  Post-op Assessment: Report given to RN and Post -op Vital signs reviewed and stable  Post vital signs: Reviewed and stable  Last Vitals:  Vitals Value Taken Time  BP    Temp    Pulse 87 10/28/19 1502  Resp 24 10/28/19 1502  SpO2 100 % 10/28/19 1502  Vitals shown include unvalidated device data.  Last Pain:  Vitals:   10/28/19 1151  TempSrc: Oral  PainSc: 0-No pain         Complications: No complications documented.

## 2019-10-28 NOTE — Anesthesia Preprocedure Evaluation (Addendum)
Anesthesia Evaluation  Patient identified by MRN, date of birth, ID band Patient awake    Reviewed: Allergy & Precautions, H&P , NPO status , Patient's Chart, lab work & pertinent test results, reviewed documented beta blocker date and time   Airway Mallampati: I  TM Distance: >3 FB Neck ROM: full    Dental no notable dental hx. (+) Teeth Intact, Dental Advisory Given   Pulmonary neg pulmonary ROS, Current Smoker and Patient abstained from smoking.,    Pulmonary exam normal breath sounds clear to auscultation       Cardiovascular Exercise Tolerance: Good negative cardio ROS   Rhythm:regular Rate:Normal     Neuro/Psych negative neurological ROS  negative psych ROS   GI/Hepatic negative GI ROS, Neg liver ROS,   Endo/Other  negative endocrine ROS  Renal/GU negative Renal ROS  negative genitourinary   Musculoskeletal   Abdominal   Peds  Hematology negative hematology ROS (+)   Anesthesia Other Findings   Reproductive/Obstetrics negative OB ROS                            Anesthesia Physical Anesthesia Plan  ASA: II  Anesthesia Plan: General   Post-op Pain Management:    Induction: Intravenous  PONV Risk Score and Plan: 3 and Ondansetron, Dexamethasone, Midazolam and Treatment may vary due to age or medical condition  Airway Management Planned: Oral ETT  Additional Equipment:   Intra-op Plan:   Post-operative Plan: Extubation in OR  Informed Consent: I have reviewed the patients History and Physical, chart, labs and discussed the procedure including the risks, benefits and alternatives for the proposed anesthesia with the patient or authorized representative who has indicated his/her understanding and acceptance.     Dental Advisory Given  Plan Discussed with: CRNA and Anesthesiologist  Anesthesia Plan Comments: (  )       Anesthesia Quick Evaluation

## 2019-10-28 NOTE — Interval H&P Note (Signed)
History and Physical Interval Note:  10/28/2019 12:35 PM  Megan Ponce  has presented today for surgery, with the diagnosis of Bilateral Pyosalpinges.  The various methods of treatment have been discussed with the patient and family. After consideration of risks, benefits and other options for treatment, the patient has consented to  Procedure(s): LAPAROSCOPIC BILATERAL SALPINGECTOMY (Bilateral) as a surgical intervention.  The patient's history has been reviewed, patient examined, no change in status, stable for surgery.  I have reviewed the patient's chart and labs.  Questions were answered to the patient's satisfaction.     Reva Bores

## 2019-10-28 NOTE — Op Note (Addendum)
PROCEDURE DATE: 10/28/2019  PREOPERATIVE DIAGNOSES: recurrent PID  POSTOPERATIVE DIAGNOSES: The same   PROCEDURE: Laparoscopic right salpingectomy, partial left salpingectomy   SURGEON: Dr. Reva Bores   ASSISTANT: Dr. Therese Sarah An experienced assistant was required given the standard of surgical care given the complexity of the case.  This assistant was needed for exposure, dissection, suctioning, retraction, instrument exchange, and for overall help during the procedure.   ANESTHESIOLOGISTBethena Midget, MD MD - GETT  INDICATIONS: 39 y.o. G0P0000 who has required hospitalization x 2 due to recurrent PID for definitive treatment.  FINDINGS: Possible endometriosis, diffuse pelvic adhesive disease, omentum stuck to the anterior abdominal wall and clubbed tubes bilaterally. Adhesion of bowel and omentum to the left tube. Ovary not visualized on the left. Right ovary appeared normal. Normal liver edge.   ESTIMATED BLOOD LOSS: 25 ml   SPECIMENS: Bilateral fallopian tubes to pathology  COMPLICATIONS: None immediately known   PROCEDURE IN DETAIL: The patient had sequential compression devices applied to her lower extremities while in the preoperative area. She was then taken to the operating room where general anesthesia was administered and was found to be adequate. She was placed in the dorsal lithotomy position, and was prepped and draped in a sterile manner. A Red rubber catheter was inserted into her bladder and drained a clear unknown amount of urine. A speculum was placed inside the vagina, and the cervix grasped with an single tooth tenaculum. A Hulka tenaculum was placed through the cervix for uterine manipulation. Attention was then turned to the patient's abdomen where a 11-mm skin incision was made in the umbilicus.  This was carried down to the underlying fascia and peritoneum.  The fascia was tagged with 0 Vicryl suture on a UR-6. Intraperitoneal placement was confirmed and  insufflation done. A survey of the patient's pelvis and abdomen revealed the findings above. Right sided 5-mm lower quadrant port was then placed under direct visualization. Adhesion in midline of filmy type taken down with the Harmonic. On the left the adhesions grew thicker and concern for bowel prevented LLQ placement of 5 mm port. 5 mm port placed in midline under direct visualization. On the right side, the tube was separated from the mesosalpinx with the Harmonic device.  On the left side, the tube was markedly adherent and the ovary and ureter and anatomy could not be easily verified due to these adhesion of tube to pelvic side wall, omentum and bowel adherent here as well. The Harmonic was used to begin at the junction of the tube and uterus and to remove an amputated portion of tube that likely did not contain the fimbriated end due to the distorted anatomy.  The specimen was then removed from the abdomen through the 11-mm port, under direct visualization. The operative site was surveyed, and found to be hemostatic. No intraoperative injury to other surrounding organs was noted. The abdomen was desufflated and all instruments were then removed from the patient's abdomen. Fascial closure was completed with aforementioned 0 Vicryl on a UR-6. All skin incisions were closed with 3-0 Vicryl subcuticular stitches/Dermabond.   Reva Bores MD 10/28/2019 2:54 PM

## 2019-10-28 NOTE — Anesthesia Procedure Notes (Signed)
Procedure Name: Intubation Date/Time: 10/28/2019 1:53 PM Performed by: Thornell Mule, CRNA Pre-anesthesia Checklist: Patient identified, Emergency Drugs available, Suction available and Patient being monitored Patient Re-evaluated:Patient Re-evaluated prior to induction Oxygen Delivery Method: Circle system utilized Preoxygenation: Pre-oxygenation with 100% oxygen Induction Type: IV induction Ventilation: Mask ventilation without difficulty Laryngoscope Size: [name] and 3 Grade View: Grade I Tube type: Oral Tube size: 7.0 mm Number of attempts: 1 Airway Equipment and Method: Stylet and Oral airway Placement Confirmation: ETT inserted through vocal cords under direct vision,  positive ETCO2 and breath sounds checked- equal and bilateral Secured at: 21 cm Tube secured with: Tape Dental Injury: Teeth and Oropharynx as per pre-operative assessment

## 2019-10-28 NOTE — Anesthesia Postprocedure Evaluation (Signed)
Anesthesia Post Note  Patient: Megan Ponce  Procedure(s) Performed: LAPAROSCOPIC BILATERAL SALPINGECTOMY (Bilateral Abdomen)     Patient location during evaluation: PACU Anesthesia Type: General Level of consciousness: awake and alert Pain management: pain level controlled Vital Signs Assessment: post-procedure vital signs reviewed and stable Respiratory status: spontaneous breathing, nonlabored ventilation, respiratory function stable and patient connected to nasal cannula oxygen Cardiovascular status: blood pressure returned to baseline and stable Postop Assessment: no apparent nausea or vomiting Anesthetic complications: no   No complications documented.  Last Vitals:  Vitals:   10/28/19 1625 10/28/19 1643  BP:  111/74  Pulse: 78 82  Resp: (!) 22 18  Temp:  36.9 C  SpO2: 98% 100%    Last Pain:  Vitals:   10/28/19 1643  TempSrc:   PainSc: 5                  [name]

## 2019-10-29 ENCOUNTER — Encounter (HOSPITAL_BASED_OUTPATIENT_CLINIC_OR_DEPARTMENT_OTHER): Payer: Self-pay | Admitting: Family Medicine

## 2019-10-30 LAB — SURGICAL PATHOLOGY

## 2019-11-12 ENCOUNTER — Encounter (HOSPITAL_COMMUNITY): Payer: Self-pay | Admitting: Urgent Care

## 2019-11-12 ENCOUNTER — Ambulatory Visit (HOSPITAL_COMMUNITY)
Admission: EM | Admit: 2019-11-12 | Discharge: 2019-11-12 | Disposition: A | Discharge status: Home / Self Care | Payer: No Typology Code available for payment source | Attending: Urgent Care | Admitting: Urgent Care

## 2019-11-12 ENCOUNTER — Other Ambulatory Visit: Payer: Self-pay

## 2019-11-12 DIAGNOSIS — L509 Urticaria, unspecified: Secondary | ICD-10-CM | POA: Diagnosis not present

## 2019-11-12 DIAGNOSIS — T781XXA Other adverse food reactions, not elsewhere classified, initial encounter: Secondary | ICD-10-CM

## 2019-11-12 MED ORDER — PREDNISONE 20 MG PO TABS: ORAL_TABLET | ORAL | 0 refills | Status: DC

## 2019-11-12 MED ORDER — HYDROXYZINE HCL 25 MG PO TABS: 12.5000 mg | ORAL_TABLET | Freq: Three times a day (TID) | ORAL | 0 refills | Status: DC | PRN

## 2019-11-12 NOTE — ED Triage Notes (Signed)
Pt presents with hives all over body and mild lip swelling since yesterday; pt states she does not have any particuliar allergies to anything but tried a new restaurant yesterday.

## 2019-11-12 NOTE — ED Triage Notes (Signed)
Pt took benadryl and zyrtec an hour before arrival.

## 2019-11-12 NOTE — ED Provider Notes (Signed)
[facility]   MRN: [id number] DOB: [birth year]  Subjective:   Megan Ponce is a 39 y.o. female presenting for 1 day hx of persistent hives all over her body, lower lip swelling. Patient tried benadryl with minimal relief. Denies starting new medications, exposure to poisonous plants, new hygiene products, new cleaning products or detergents. She did get oxycodone following her recent salpingectomy but had a very small supply and is no longer using this. The only inciting factor she can think of is eating out at a new restaurant.    No current facility-administered medications for this encounter.  Current Outpatient Medications:    oxyCODONE (OXY IR/ROXICODONE) 5 MG immediate release tablet, Take 1 tablet (5 mg total) by mouth every 6 (six) hours as needed for severe pain., Disp: 8 tablet, Rfl: 0   No Known Allergies  History reviewed. No pertinent past medical history.   Past Surgical History:  Procedure Laterality Date   LAPAROSCOPIC BILATERAL SALPINGECTOMY Bilateral 10/28/2019   Procedure: LAPAROSCOPIC BILATERAL SALPINGECTOMY;  Surgeon: Reva Bores, MD;  Location: [facility];  Service: Gynecology;  Laterality: Bilateral;   NO PAST SURGERIES      Family History  Problem Relation Age of Onset   Heart disease Father    Multiple sclerosis Mother    Breast cancer Mother 15    Social History   Tobacco Use   Smoking status: Current Every Day Smoker    Packs/day: 0.25    Types: Cigarettes   Smokeless tobacco: Never Used  Vaping Use   Vaping Use: Never used  Substance Use Topics   Alcohol use: No   Drug use: No    ROS   Objective:   Vitals: BP 115/77 (BP Location: Right Arm)    Pulse (!) 116    Temp 98.3 F (36.8 C) (Oral)    Resp 20    SpO2 100%   Pulse was 99-108 on recheck.   Physical Exam Constitutional:      General: She is not in acute distress.    Appearance: Normal appearance. She is well-developed. She  is not ill-appearing, toxic-appearing or diaphoretic.  HENT:     Head: Normocephalic and atraumatic.     Right Ear: External ear normal.     Left Ear: External ear normal.     Nose: Nose normal.     Mouth/Throat:     Mouth: Mucous membranes are moist.     Comments: Trace lower lip swelling. Airway is patent, controlling secretions, speaking in full sentences. Eyes:     General: No scleral icterus.       Right eye: No discharge.        Left eye: No discharge.     Extraocular Movements: Extraocular movements intact.     Conjunctiva/sclera: Conjunctivae normal.     Pupils: Pupils are equal, round, and reactive to light.  Cardiovascular:     Rate and Rhythm: Regular rhythm. Tachycardia present.     Pulses: Normal pulses.     Heart sounds: Normal heart sounds. No murmur heard.  No friction rub. No gallop.   Pulmonary:     Effort: Pulmonary effort is normal. No respiratory distress.     Breath sounds: Normal breath sounds. No stridor. No wheezing, rhonchi or rales.  Skin:    General: Skin is warm and dry.     Findings: Rash (diffuse urticaria throughout sparing mostly the face) present.  Neurological:     General: No focal deficit  present.     Mental Status: She is alert and oriented to person, place, and time.  Psychiatric:        Mood and Affect: Mood normal.        Behavior: Behavior normal.        Thought Content: Thought content normal.        Judgment: Judgment normal.       Assessment and Plan :   PDMP not reviewed this encounter.  1. Hives   2. Allergic reaction to food, initial encounter     Low suspicion for medication reaction that she is not using oxycodone consistently. Recommended prednisone course, hydroxyzine. Avoid any new exposures and eating out. Counseled patient on potential for adverse effects with medications prescribed/recommended today, ER and return-to-clinic precautions discussed, patient verbalized understanding.    Wallis Bamberg, PA-C 11/12/19  1537

## 2019-11-30 ENCOUNTER — Other Ambulatory Visit: Payer: Self-pay

## 2019-11-30 ENCOUNTER — Ambulatory Visit (INDEPENDENT_AMBULATORY_CARE_PROVIDER_SITE_OTHER): Payer: PRIVATE HEALTH INSURANCE | Admitting: Family Medicine

## 2019-11-30 ENCOUNTER — Encounter: Payer: Self-pay | Admitting: Family Medicine

## 2019-11-30 VITALS — BP 120/68 | HR 85 | Wt 187.7 lb

## 2019-11-30 DIAGNOSIS — Z09 Encounter for follow-up examination after completed treatment for conditions other than malignant neoplasm: Secondary | ICD-10-CM

## 2019-11-30 DIAGNOSIS — Z720 Tobacco use: Secondary | ICD-10-CM

## 2019-11-30 DIAGNOSIS — N809 Endometriosis, unspecified: Secondary | ICD-10-CM

## 2019-11-30 MED ORDER — BUPROPION HCL ER (SR) 150 MG PO TB12: 150.0000 mg | ORAL_TABLET | Freq: Two times a day (BID) | ORAL | 2 refills | Status: DC

## 2019-11-30 NOTE — Assessment & Plan Note (Signed)
Likely, she does smoke, but could consider Aygestin or Orilissa if having pain.

## 2019-11-30 NOTE — Progress Notes (Signed)
Subjective:    Patient ID: Megan Ponce is a 39 y.o. female presenting with Post-op Follow-up  on 11/30/2019  HPI: Here today for postop check. She is s/p bilateral salpingectomy for recurrent PID. Found to have probable endometriosis. Pathology reveals salpingitis and hydrosalpinx. Denies F/C, N/V, normal urine and bowel function. Wants to quit smoking.  Review of Systems  Constitutional: Negative for chills and fever.  Respiratory: Negative for shortness of breath.   Cardiovascular: Negative for chest pain.  Gastrointestinal: Negative for abdominal pain, nausea and vomiting.  Genitourinary: Negative for dysuria.  Skin: Negative for rash.      Objective:    BP 120/68   Pulse 85   Wt 187 lb 11.2 oz (85.1 kg)   LMP 11/23/2019 (Exact Date)   BMI 32.22 kg/m  Physical Exam Constitutional:      General: She is not in acute distress.    Appearance: She is well-developed.  HENT:     Head: Normocephalic and atraumatic.  Eyes:     General: No scleral icterus. Cardiovascular:     Rate and Rhythm: Normal rate.  Pulmonary:     Effort: Pulmonary effort is normal.  Abdominal:     Palpations: Abdomen is soft.  Musculoskeletal:     Cervical back: Neck supple.  Skin:    General: Skin is warm and dry.  Neurological:     Mental Status: She is alert and oriented to person, place, and time.         Assessment & Plan:   Problem List Items Addressed This Visit      Unprioritized   Endometriosis    Likely, she does smoke, but could consider Aygestin or Orilissa if having pain.      Tobacco use - Primary    Congratulations on wanting to quit smoking! I am so happy to hear this! Here are some suggested steps to help make this process easier: 1. Set a quit date - usually 2-4 wks in the future - gives you time to mentally prepare 2. Enlist the help of someone - it is easier with a cheerleader 3. Think about your triggers - do you like to have one after meals, first thing in  the morning, etc. 4. Write down a list of alternatives to lighting up - hopefully not involving consuming more calories     A. Making a quick phone call - bonus, reconnecting with someone you care about     B. Chewing a piece of sugar-free gum     C. Drinking a large glass of water - bonus, you need to sty hydrated while pregnant     D. Taking a 10 minute walk - bonus, getting healthy, burning calories, improves mood 5. Put the list where you can see it and quickly use it when the cravings hit 6. Write a list of why you should quit     A. Your breath will smell better     B. Your lungs will thank you - they start to improve very quickly     C. Your pocketbook will thank you - actually write down what you spend on cigarettes/wk or month and start saving that money--at a bank or at home in a jar. Do the quick math, and you can probably save enough money in 1 year for a cool vacation or something special you have your eye on.     D. Insert your reasons here 7. Post this list where you can see it if  your motivation wanes 8. Consider reducing how many cigarettes you smoke daily. Reduce the number by 1 everyday until the quit date arrives.     Example: You smoke 10/day, then tomorrow, you are only allowed 9/day for a couple of days, then 8/day and so on. By the time you get to your quit date, hopefully, you are down to < 5/day.  --discussed Wellbutrin or Chantix. Has tried and failed with the patch--she opts for Wellbutrin.      Relevant Medications   buPROPion (WELLBUTRIN SR) 150 MG 12 hr tablet    Other Visit Diagnoses    Postop check       Doing well. reviewed path, photos, discussed options if pain returns.      Total time in review of prior notes, pathology, labs, history taking, review with patient, exam, note writing, discussion of options, plan for next steps, alternatives and risks of treatment: 24 minutes.  Return in about 6 months (around 05/29/2020) for a CPE, in person.  Reva Bores 11/30/2019 10:26 AM

## 2019-11-30 NOTE — Patient Instructions (Signed)
Bupropion sustained-release tablets (smoking cessation) What is this medicine? BUPROPION (byoo PROE pee on) is used to help people quit smoking. This medicine may be used for other purposes; ask your health care provider or pharmacist if you have questions. COMMON BRAND NAME(S): Buproban, Zyban What should I tell my health care provider before I take this medicine? They need to know if you have any of these conditions:  an eating disorder, such as anorexia or bulimia  bipolar disorder or psychosis  diabetes or high blood sugar, treated with medication  glaucoma  head injury or brain tumor  heart disease, previous heart attack, or irregular heart beat  high blood pressure  kidney or liver disease  seizures  suicidal thoughts or a previous suicide attempt  Tourette's syndrome  weight loss  an unusual or allergic reaction to bupropion, other medicines, foods, dyes, or preservatives  breast-feeding  pregnant or trying to become pregnant How should I use this medicine? Take this medicine by mouth with a glass of water. Follow the directions on the prescription label. You can take it with or without food. If it upsets your stomach, take it with food. Do not cut, crush or chew this medicine. Take your medicine at regular intervals. If you take this medicine more than once a day, take your second dose at least 8 hours after you take your first dose. To limit difficulty in sleeping, avoid taking this medicine at bedtime. Do not take your medicine more often than directed. Do not stop taking this medicine suddenly except upon the advice of your doctor. Stopping this medicine too quickly may cause serious side effects. A special MedGuide will be given to you by the pharmacist with each prescription and refill. Be sure to read this information carefully each time. Talk to your pediatrician regarding the use of this medicine in children. Special care may be needed. Overdosage: If you  think you have taken too much of this medicine contact a poison control center or emergency room at once. NOTE: This medicine is only for you. Do not share this medicine with others. What if I miss a dose? If you miss a dose, skip the missed dose and take your next tablet at the regular time. There should be at least 8 hours between doses. Do not take double or extra doses. What may interact with this medicine? Do not take this medicine with any of the following medications:  linezolid  MAOIs like Azilect, Carbex, Eldepryl, Marplan, Nardil, and Parnate  methylene blue (injected into a vein)  other medicines that contain bupropion like Wellbutrin This medicine may also interact with the following medications:  alcohol  certain medicines for anxiety or sleep  certain medicines for blood pressure like metoprolol, propranolol  certain medicines for depression or psychotic disturbances  certain medicines for HIV or AIDS like efavirenz, lopinavir, nelfinavir, ritonavir  certain medicines for irregular heart beat like propafenone, flecainide  certain medicines for Parkinson's disease like amantadine, levodopa  certain medicines for seizures like carbamazepine, phenytoin, phenobarbital  cimetidine  clopidogrel  cyclophosphamide  digoxin  furazolidone  isoniazid  nicotine  orphenadrine  procarbazine  steroid medicines like prednisone or cortisone  stimulant medicines for attention disorders, weight loss, or to stay awake  tamoxifen  theophylline  thiotepa  ticlopidine  tramadol  warfarin This list may not describe all possible interactions. Give your health care provider a list of all the medicines, herbs, non-prescription drugs, or dietary supplements you use. Also tell them if you smoke,   drink alcohol, or use illegal drugs. Some items may interact with your medicine. What should I watch for while using this medicine? Visit your doctor or healthcare provider  for regular checks on your progress. This medicine should be used together with a patient support program. It is important to participate in a behavioral program, counseling, or other support program that is recommended by your healthcare provider. This medicine may cause serious skin reactions. They can happen weeks to months after starting the medicine. Contact your healthcare provider right away if you notice fevers or flu-like symptoms with a rash. The rash may be red or purple and then turn into blisters or peeling of the skin. Or, you might notice a red rash with swelling of the face, lips or lymph nodes in your neck or under your arms. Patients and their families should watch out for new or worsening thoughts of suicide or depression. Also watch out for sudden changes in feelings such as feeling anxious, agitated, panicky, irritable, hostile, aggressive, impulsive, severely restless, overly excited and hyperactive, or not being able to sleep. If this happens, especially at the beginning of treatment or after a change in dose, call your healthcare provider. Avoid alcoholic drinks while taking this medicine. Drinking excessive alcoholic beverages, using sleeping or anxiety medicines, or quickly stopping the use of these agents while taking this medicine may increase your risk for a seizure. Do not drive or use heavy machinery until you know how this medicine affects you. This medicine can impair your ability to perform these tasks. Do not take this medicine close to bedtime. It may prevent you from sleeping. Your mouth may get dry. Chewing sugarless gum or sucking hard candy, and drinking plenty of water may help. Contact your doctor if the problem does not go away or is severe. Do not use nicotine patches or chewing gum without the advice of your doctor or healthcare provider while taking this medicine. You may need to have your blood pressure taken regularly if your doctor recommends that you use both  nicotine and this medicine together. What side effects may I notice from receiving this medicine? Side effects that you should report to your doctor or health care professional as soon as possible:  allergic reactions like skin rash, itching or hives, swelling of the face, lips, or tongue  breathing problems  changes in vision  confusion  elevated mood, decreased need for sleep, racing thoughts, impulsive behavior  fast or irregular heartbeat  hallucinations, loss of contact with reality  increased blood pressure  rash, fever, and swollen lymph nodes  redness, blistering, peeling, or loosening of the skin, including inside the mouth  seizures  suicidal thoughts or other mood changes  unusually weak or tired  vomiting Side effects that usually do not require medical attention (report to your doctor or health care professional if they continue or are bothersome):  constipation  headache  loss of appetite  nausea  tremors  weight loss This list may not describe all possible side effects. Call your doctor for medical advice about side effects. You may report side effects to FDA at 1-800-FDA-1088. Where should I keep my medicine? Keep out of the reach of children. Store at room temperature between 20 and 25 degrees C (68 and 77 degrees F). Protect from light. Keep container tightly closed. Throw away any unused medicine after the expiration date. NOTE: This sheet is a summary. It may not cover all possible information. If you have questions about this medicine,   talk to your doctor, pharmacist, or health care provider.  2020 Elsevier/Gold Standard (2018-04-10 13:59:09)  

## 2019-11-30 NOTE — Assessment & Plan Note (Signed)
Congratulations on wanting to quit smoking! I am so happy to hear this! Here are some suggested steps to help make this process easier: 1. Set a quit date - usually 2-4 wks in the future - gives you time to mentally prepare 2. Enlist the help of someone - it is easier with a cheerleader 3. Think about your triggers - do you like to have one after meals, first thing in the morning, etc. 4. Write down a list of alternatives to lighting up - hopefully not involving consuming more calories     A. Making a quick phone call - bonus, reconnecting with someone you care about     B. Chewing a piece of sugar-free gum     C. Drinking a large glass of water - bonus, you need to sty hydrated while pregnant     D. Taking a 10 minute walk - bonus, getting healthy, burning calories, improves mood 5. Put the list where you can see it and quickly use it when the cravings hit 6. Write a list of why you should quit     A. Your breath will smell better     B. Your lungs will thank you - they start to improve very quickly     C. Your pocketbook will thank you - actually write down what you spend on cigarettes/wk or month and start saving that money--at a bank or at home in a jar. Do the quick math, and you can probably save enough money in 1 year for a cool vacation or something special you have your eye on.     D. Insert your reasons here 7. Post this list where you can see it if your motivation wanes 8. Consider reducing how many cigarettes you smoke daily. Reduce the number by 1 everyday until the quit date arrives.     Example: You smoke 10/day, then tomorrow, you are only allowed 9/day for a couple of days, then 8/day and so on. By the time you get to your quit date, hopefully, you are down to < 5/day.  --discussed Wellbutrin or Chantix. Has tried and failed with the patch--she opts for Wellbutrin.

## 2020-09-26 ENCOUNTER — Other Ambulatory Visit (HOSPITAL_COMMUNITY)
Admission: RE | Admit: 2020-09-26 | Discharge: 2020-09-26 | Disposition: A | Discharge status: Home / Self Care | Payer: PRIVATE HEALTH INSURANCE | Source: Ambulatory Visit | Attending: Obstetrics and Gynecology | Admitting: Obstetrics and Gynecology

## 2020-09-26 ENCOUNTER — Other Ambulatory Visit: Payer: Self-pay

## 2020-09-26 ENCOUNTER — Encounter: Payer: Self-pay | Admitting: Obstetrics and Gynecology

## 2020-09-26 ENCOUNTER — Ambulatory Visit (INDEPENDENT_AMBULATORY_CARE_PROVIDER_SITE_OTHER): Payer: No Typology Code available for payment source | Admitting: Obstetrics and Gynecology

## 2020-09-26 VITALS — BP 117/85 | HR 96 | Wt 193.3 lb

## 2020-09-26 DIAGNOSIS — Z01419 Encounter for gynecological examination (general) (routine) without abnormal findings: Secondary | ICD-10-CM | POA: Diagnosis not present

## 2020-09-26 DIAGNOSIS — Z1231 Encounter for screening mammogram for malignant neoplasm of breast: Secondary | ICD-10-CM | POA: Diagnosis not present

## 2020-09-26 DIAGNOSIS — N809 Endometriosis, unspecified: Secondary | ICD-10-CM

## 2020-09-26 DIAGNOSIS — Z113 Encounter for screening for infections with a predominantly sexual mode of transmission: Secondary | ICD-10-CM

## 2020-09-26 DIAGNOSIS — N949 Unspecified condition associated with female genital organs and menstrual cycle: Secondary | ICD-10-CM

## 2020-09-26 DIAGNOSIS — R399 Unspecified symptoms and signs involving the genitourinary system: Secondary | ICD-10-CM

## 2020-09-26 LAB — POCT URINALYSIS DIP (DEVICE)
Bilirubin Urine: NEGATIVE
Glucose, UA: NEGATIVE mg/dL
Hgb urine dipstick: NEGATIVE
Ketones, ur: NEGATIVE mg/dL
Leukocytes,Ua: NEGATIVE
Nitrite: NEGATIVE
Protein, ur: NEGATIVE mg/dL
Specific Gravity, Urine: <=1.005 (ref 1.005–1.030)
Urobilinogen, UA: 0.2 mg/dL (ref 0.0–1.0)
pH: 5.5 (ref 5.0–8.0)

## 2020-09-26 NOTE — Patient Instructions (Addendum)
Please call your insurance company to verify [facility] is in network. Call [phone] to reschedule appointment.

## 2020-09-26 NOTE — Progress Notes (Signed)
Obstetrics and Gynecology Annual Patient Evaluation  Appointment Date: 09/26/2020  OBGYN Clinic: [facility]  Referring Provider: No ref. provider found  Chief Complaint:  Chief Complaint  Patient presents with   Gynecologic Exam    History of Present Illness: Megan Ponce is a 40 y.o. G0 (Patient's last menstrual period was 09/12/2020 (approximate).), seen for the above chief complaint. Her past medical history is significant for h/o l/s bilateral salpingectomy for recurrent TOAs, ?endo see on laparoscopy   Patient endorsing h/o discharge (no smell) but states smell and d/c issues are much better since her l/s BS  Review of Systems: Pertinent items noted in HPI and remainder of comprehensive ROS otherwise negative.   Past Medical History:  No past medical history on file.  Past Surgical History:  Past Surgical History:  Procedure Laterality Date   LAPAROSCOPIC BILATERAL SALPINGECTOMY Bilateral 10/28/2019   Procedure: LAPAROSCOPIC BILATERAL SALPINGECTOMY;  Surgeon: Reva Bores, MD;  Location: [facility];  Service: Gynecology;  Laterality: Bilateral;    Past Obstetrical History:  OB History  Gravida Para Term Preterm AB Living  0 0 0 0 0 0  SAB IAB Ectopic Multiple Live Births  0 0 0 0 0    Past Gynecological History: As per HPI Periods: qmonth, regular, <1wk, not heavy or painful. She denies any pelvic pain outside of periods History of Pap Smear(s): Yes.   Last pap 2020, which was cytology and HPV negative  Social History:  Social History   Socioeconomic History   Marital status: Single    Spouse name: Not on file   Number of children: Not on file   Years of education: Not on file   Highest education level: Not on file  Occupational History   Not on file  Tobacco Use   Smoking status: Every Day    Packs/day: 0.25    Types: Cigarettes   Smokeless tobacco: Never   Tobacco comments:    7-8 cigarettes  /day  Vaping Use   Vaping Use: Never used  Substance and Sexual Activity   Alcohol use: No   Drug use: No   Sexual activity: Yes    Birth control/protection: None  Other Topics Concern   Not on file  Social History Narrative   Not on file   Social Determinants of Health   Financial Resource Strain: Not on file  Food Insecurity: Food Insecurity Present   Worried About Running Out of Food in the Last Year: Never true   Ran Out of Food in the Last Year: Sometimes true  Transportation Needs: No Transportation Needs   Lack of Transportation (Medical): No   Lack of Transportation (Non-Medical): No  Physical Activity: Not on file  Stress: Not on file  Social Connections: Not on file  Intimate Partner Violence: Not on file    Family History:  Family History  Problem Relation Age of Onset   Multiple sclerosis Mother    Heart disease Father     Medications Daena Alper. Pilar had no medications administered during this visit. Current Outpatient Medications  Medication Sig Dispense Refill   diphenhydrAMINE HCl, Sleep, (ZZZQUIL PO) Take by mouth.     buPROPion (WELLBUTRIN SR) 150 MG 12 hr tablet Take 1 tablet (150 mg total) by mouth 2 (two) times daily. (Patient not taking: Reported on 09/26/2020) 120 tablet 2   No current facility-administered medications for this visit.    Allergies Patient has no known allergies.   Physical  Exam:  BP 117/85   Pulse 96   Wt 193 lb 4.8 oz (87.7 kg)   LMP 09/12/2020 (Approximate)   BMI 33.18 kg/m  Body mass index is 33.18 kg/m. General appearance: Well nourished, well developed female in no acute distress.  Neck:  Supple, normal appearance, and no thyromegaly  Cardiovascular: normal s1 and s2.  No murmurs, rubs or gallops. Respiratory:  Clear to auscultation bilateral. Normal respiratory effort Abdomen: positive bowel sounds and no masses, hernias; diffusely non tender to palpation, non distended Breasts: breasts appear normal, no  suspicious masses, no skin or nipple changes or axillary nodes, and normal palpation. Neuro/Psych:  Normal mood and affect.  Skin:  Warm and dry.  Lymphatic:  No inguinal lymphadenopathy.   Pelvic exam: is not limited by body habitus EGBUS: within normal limits Vagina: within normal limits and with no blood but white, non malodorous discharge in the vault Cervix: normal appearing cervix without tenderness, discharge or lesions. Uterus:  nonenlarged and non tender Adnexa:  normal adnexa and no mass, fullness, tenderness Rectovaginal: deferred  Laboratory: u dip negative  Radiology: none  Assessment: pt doing well  Plan:  1. Encounter for screening mammogram for malignant neoplasm of breast Pt amenable to screening mammo - MM 3D SCREEN BREAST BILATERAL; Future  2. Screening for STD (sexually transmitted disease) Patient desires - Cervicovaginal ancillary only( [place]) - HIV antibody (with reflex) - RPR - Hepatitis B Surface AntiGEN - Hepatitis C Antibody  3. Vaginal discomfort - Cervicovaginal ancillary only( [place])  4. Endometriosis I told her the dx is questionable with no pathology obtained during surgery. Since asymptomatic, I told her I don't feel she needs to be on progestins. She states she never did the POPs given after surgery anyways  5. Lower urinary tract symptoms (LUTS) Pt feels like she has a UTI.  - Urine Culture  6. Well woman exam with routine gynecological exam Routine care.  - HIV antibody (with reflex) - RPR - Hepatitis B Surface AntiGEN - Hepatitis C Antibody - Urine Culture   RTC 1 year and PRN  Cornelia Copa MD Attending Center for [facility])

## 2020-09-27 LAB — CERVICOVAGINAL ANCILLARY ONLY
Bacterial Vaginitis (gardnerella): POSITIVE — AB
Candida Glabrata: NEGATIVE
Candida Vaginitis: NEGATIVE
Chlamydia: NEGATIVE
Comment: NEGATIVE
Comment: NEGATIVE
Comment: NEGATIVE
Comment: NEGATIVE
Comment: NEGATIVE
Comment: NORMAL
Neisseria Gonorrhea: NEGATIVE
Trichomonas: NEGATIVE

## 2020-09-27 LAB — HEPATITIS C ANTIBODY: Hep C Virus Ab: <0.1 s/co ratio (ref 0.0–0.9)

## 2020-09-27 LAB — RPR: RPR Ser Ql: NONREACTIVE

## 2020-09-27 LAB — HEPATITIS B SURFACE ANTIGEN: Hepatitis B Surface Ag: NEGATIVE

## 2020-09-27 LAB — URINE CULTURE

## 2020-09-27 LAB — HIV ANTIBODY (ROUTINE TESTING W REFLEX): HIV Screen 4th Generation wRfx: NONREACTIVE

## 2020-09-28 ENCOUNTER — Telehealth: Payer: Self-pay

## 2020-09-28 DIAGNOSIS — N760 Acute vaginitis: Secondary | ICD-10-CM

## 2020-09-28 DIAGNOSIS — B9689 Other specified bacterial agents as the cause of diseases classified elsewhere: Secondary | ICD-10-CM

## 2020-09-28 MED ORDER — METRONIDAZOLE 500 MG PO TABS: 500.0000 mg | ORAL_TABLET | Freq: Two times a day (BID) | ORAL | 0 refills | Status: DC

## 2020-09-28 NOTE — Telephone Encounter (Signed)
Patient called nurse phone requesting treatment for positive BV result. Flagyl sent per protocol. Encouraged pt to take with a meal and avoid all alcohol while taking this medication.

## 2020-11-07 ENCOUNTER — Ambulatory Visit: Payer: No Typology Code available for payment source

## 2021-03-02 IMAGING — CT CT ABD-PELV W/ CM
2 of 5 series · 15 of 46 positions shown, 17 images · IV contrast (Omni 300)
Comparison: none

CLINICAL DATA: Abdominal pain.

EXAM:
CT ABDOMEN AND PELVIS WITH CONTRAST
TECHNIQUE: Multidetector CT imaging of the abdomen and pelvis was performed
using the standard protocol following bolus administration of
intravenous contrast.
CONTRAST:  100 cc Omnipaque 300

[Series 5: thins · axial · 0.83mm/px · z∈[+877,+1323]mm · 12 of 1048 slices shown, 14 images]
[im 78/1048  soft-tissue]
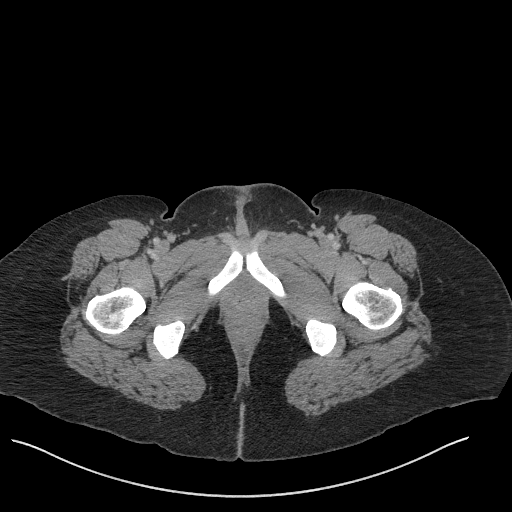
[im 78/1048  bone]
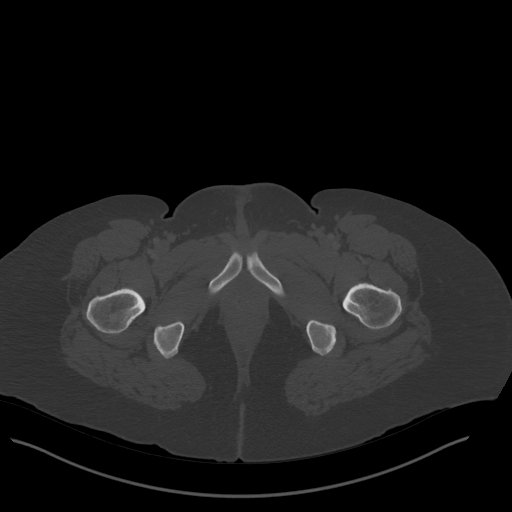
[im 156/1048  soft-tissue]
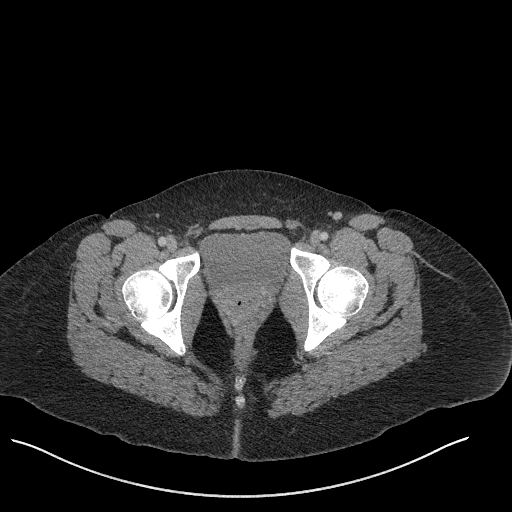
[im 233/1048  soft-tissue]
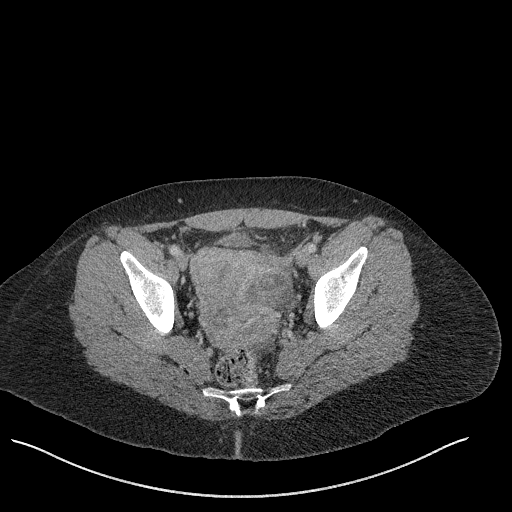
[im 311/1048  soft-tissue]
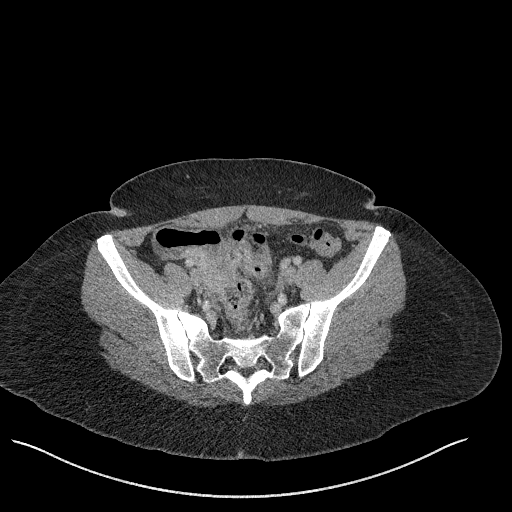
[im 388/1048  soft-tissue]
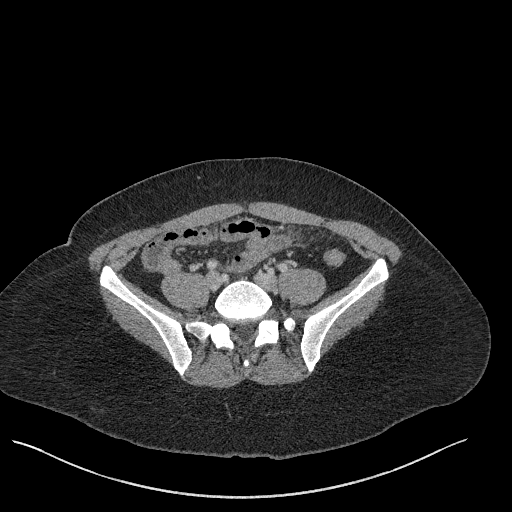
[im 466/1048  soft-tissue]
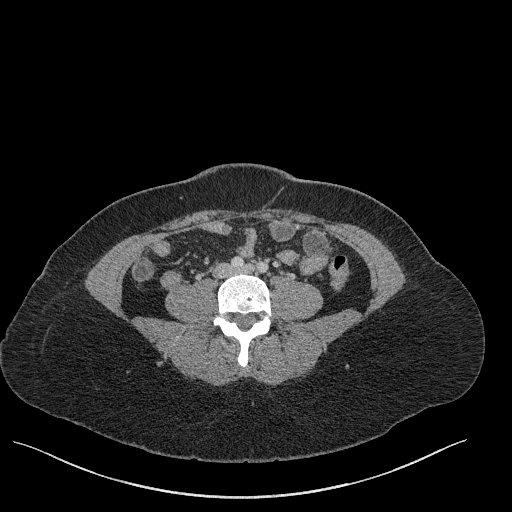
[im 582/1048  soft-tissue]
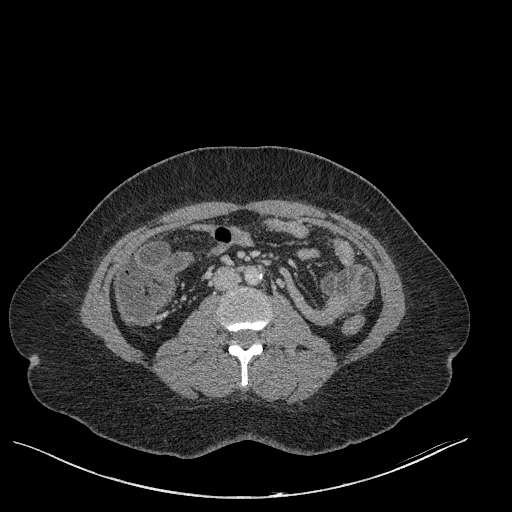
[im 660/1048  soft-tissue]
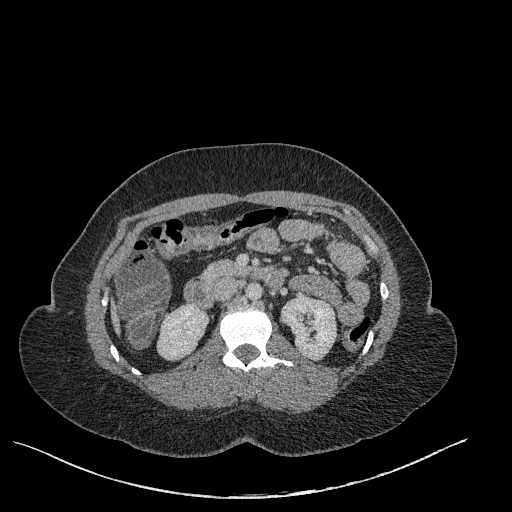
[im 737/1048  soft-tissue]
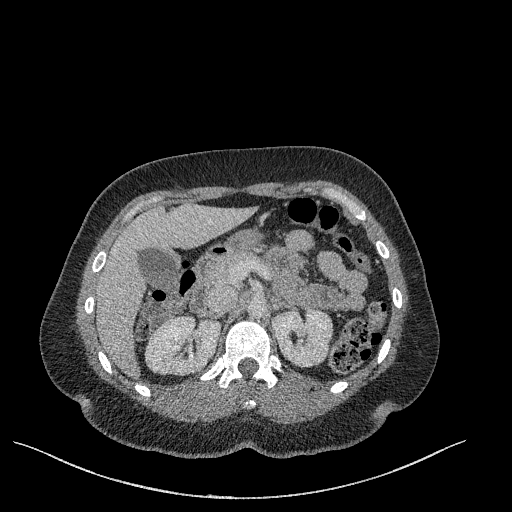
[im 737/1048  bone]
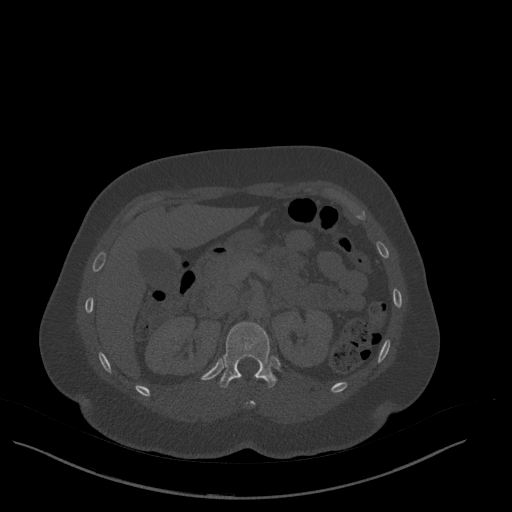
[im 815/1048  soft-tissue]
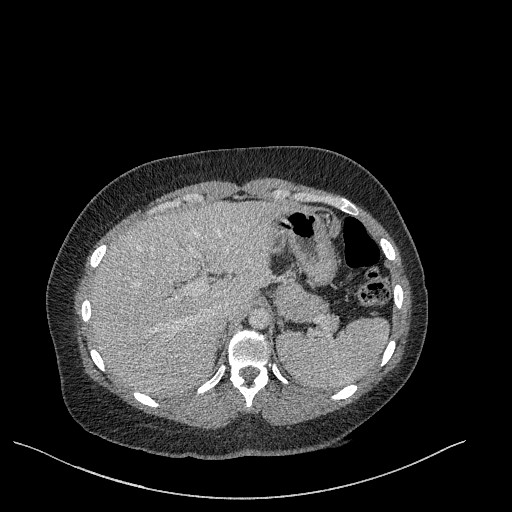
[im 892/1048  soft-tissue]
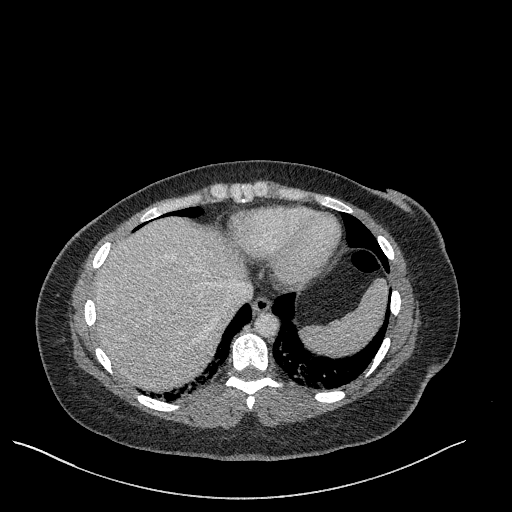
[im 970/1048  soft-tissue]
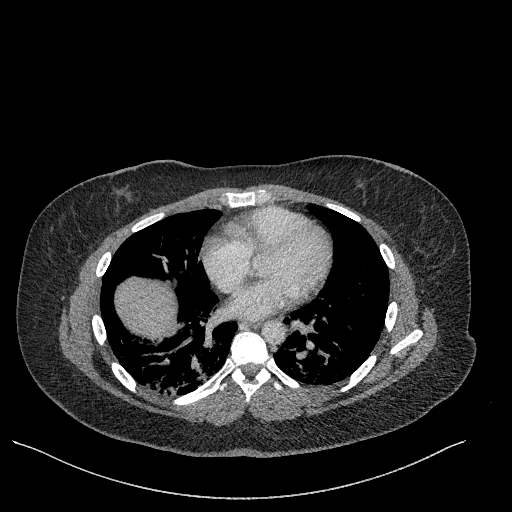

[Series 6: a/p w/ cor · coronal · 0.70mm/px · 3 of 128 slices shown]
[im 43/128  soft-tissue]
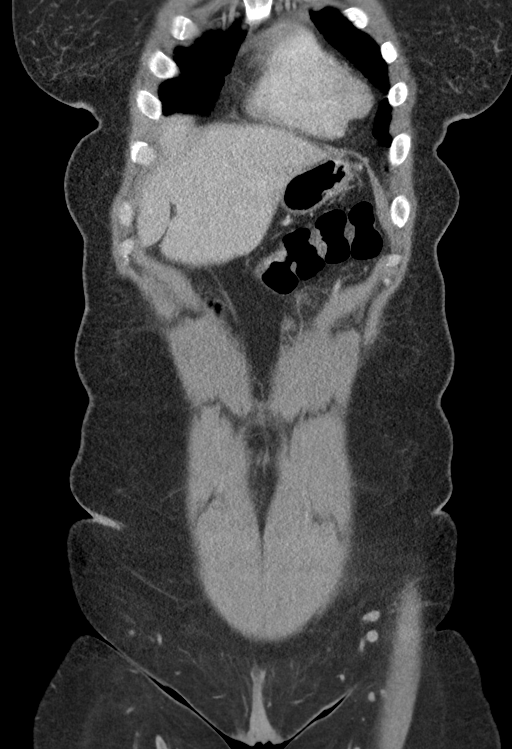
[im 57/128  soft-tissue]
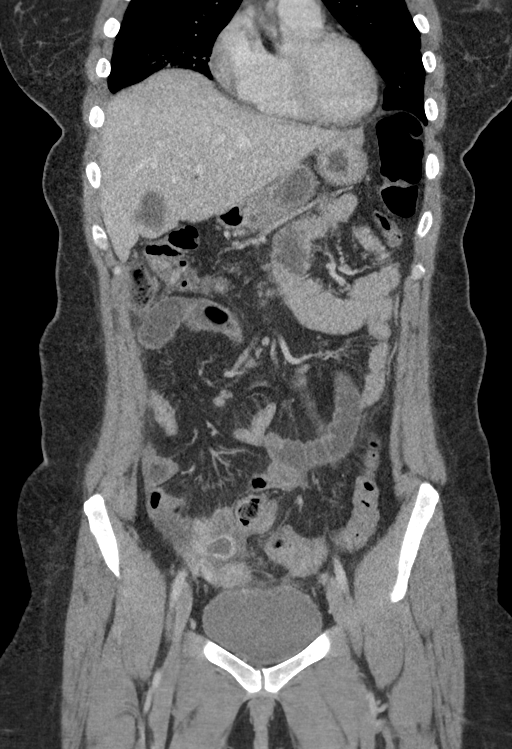
[im 71/128  soft-tissue]
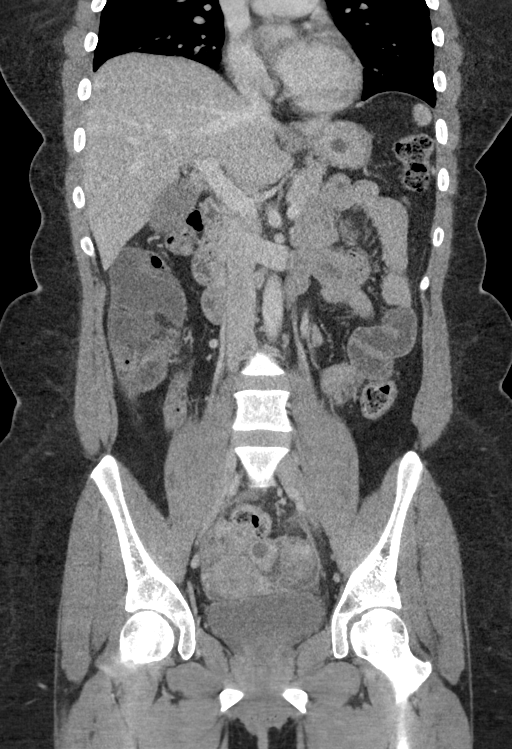

[15 of 46 positions shown; findings below may reference images not displayed]

FINDINGS: Lower chest: Patchy bibasilar infiltrates. No pleural effusion or
pulmonary lesions. The heart is normal in size. No pericardial
effusion.

Hepatobiliary: No hepatic lesions or intrahepatic biliary
dilatation. The gallbladder appears normal. No common bile duct
dilatation.

Pancreas: No mass, inflammation or ductal dilatation.

Spleen: Normal size.  No focal lesions.

Adrenals/Urinary Tract: The adrenal glands and kidneys are
unremarkable. No renal, ureteral or bladder calculi or mass.

Stomach/Bowel: The stomach, duodenum and small bowel are grossly
normal without oral contrast. There are some fluid-filled loops of
small bowel but no significant air-fluid levels. The distal and
terminal ileum appear normal.

Some fluid in the right colon but the rest of the colon is
unremarkable.

Vascular/Lymphatic: The aorta is normal in caliber. No dissection.
The branch vessels are patent. The major venous structures are
patent. No mesenteric or retroperitoneal mass or adenopathy. Small
scattered lymph nodes are noted.

Reproductive: Fluid-filled tubular serpiginous structures
surrounding both ovaries most likely hydrosalpinx. Moderate thick
walled enhancement is noted. Could not exclude pyosalpinx. The
ovaries are grossly normal. No discrete TOA. Pelvic ultrasound may
be helpful for further evaluation.

The uterus is unremarkable.

Other: No free pelvic fluid collections or pelvic abscess. No pelvic
or inguinal adenopathy.

Musculoskeletal: No significant bony findings.
IMPRESSION: 1. Fluid-filled tubular structures surrounding both ovaries most
likely hydrosalpinx. Moderate thick walled enhancement is noted.
Could not exclude pyosalpinx. Pelvic ultrasound may be helpful for
further evaluation.
2. Patchy bibasilar infiltrates.

## 2021-12-11 ENCOUNTER — Other Ambulatory Visit: Payer: Self-pay | Admitting: Obstetrics and Gynecology

## 2021-12-11 DIAGNOSIS — B9689 Other specified bacterial agents as the cause of diseases classified elsewhere: Secondary | ICD-10-CM

## 2021-12-11 MED ORDER — METRONIDAZOLE 0.75 % VA GEL: VAGINAL | 0 refills | Status: DC

## 2021-12-11 MED ORDER — FLUCONAZOLE 150 MG PO TABS: 150.0000 mg | ORAL_TABLET | Freq: Once | ORAL | 0 refills | Status: AC

## 2021-12-11 NOTE — Telephone Encounter (Signed)
Called pt regarding medication request received from pharmacy for Metronidazole tablets. She stated she has had BV and yeast in the past and is currently having some sx. She has vaginal odor and some itching. Pt was advised that Rx will be sent. Dosing instructions reviewed. Pt requested Metrogel instead of tablets. Understanding of all information given was voiced by pt.

## 2022-10-04 ENCOUNTER — Ambulatory Visit: Payer: No Typology Code available for payment source | Admitting: Obstetrics and Gynecology

## 2022-10-04 ENCOUNTER — Other Ambulatory Visit: Payer: Self-pay

## 2022-10-24 ENCOUNTER — Ambulatory Visit: Payer: No Typology Code available for payment source | Admitting: Obstetrics and Gynecology

## 2022-11-26 ENCOUNTER — Ambulatory Visit: Payer: No Typology Code available for payment source | Attending: Family Medicine

## 2022-11-26 ENCOUNTER — Ambulatory Visit (INDEPENDENT_AMBULATORY_CARE_PROVIDER_SITE_OTHER): Payer: No Typology Code available for payment source | Admitting: Family Medicine

## 2022-11-26 ENCOUNTER — Encounter: Payer: Self-pay | Admitting: Family Medicine

## 2022-11-26 VITALS — BP 122/90 | HR 85 | Temp 98.2°F | Ht 63.75 in | Wt 191.6 lb

## 2022-11-26 DIAGNOSIS — R002 Palpitations: Secondary | ICD-10-CM

## 2022-11-26 DIAGNOSIS — Z1231 Encounter for screening mammogram for malignant neoplasm of breast: Secondary | ICD-10-CM

## 2022-11-26 DIAGNOSIS — Z7689 Persons encountering health services in other specified circumstances: Secondary | ICD-10-CM

## 2022-11-26 DIAGNOSIS — E6609 Other obesity due to excess calories: Secondary | ICD-10-CM | POA: Diagnosis not present

## 2022-11-26 DIAGNOSIS — R079 Chest pain, unspecified: Secondary | ICD-10-CM

## 2022-11-26 DIAGNOSIS — Z803 Family history of malignant neoplasm of breast: Secondary | ICD-10-CM

## 2022-11-26 DIAGNOSIS — Z23 Encounter for immunization: Secondary | ICD-10-CM | POA: Diagnosis not present

## 2022-11-26 DIAGNOSIS — R519 Headache, unspecified: Secondary | ICD-10-CM | POA: Diagnosis not present

## 2022-11-26 NOTE — Patient Instructions (Addendum)
-  It was a pleasure to meet you and look forward to taking care of you. -Ordered mammogram screening. -Influenza vaccine administered today.  -EKG completed today with no concerns.  -Ordered a portable cardiac monitor that should arrive in the mail within 10 days. Once it is returned, cardiology will notify you of results. -Ordered labs. Office will call with lab results and you will see them on MyChart.  -Follow up in 1 month.

## 2022-11-26 NOTE — Progress Notes (Signed)
New Patient Office Visit  Subjective    Patient ID: Megan Ponce, female    DOB: Aug 22, 1980  Age: 42 y.o. MRN: [id number]  CC:  Chief Complaint  Patient presents with   Establish Care    Has been having chest pains, comes and goes. Mostly at night, on for a few days and off af or a few days     HPI Megan Ponce presents to establish care with new provider.   Patients previous primary care provider: None  Specialist: [facility] with Dr. [place] Bing, last seen 09/26/2020.   Patient is complaining of intermittent chest pain. Initially started a month ago. Chest pain described as dull pain. Denies pain radiating. Usually occurs a nights when lying down and resolves. Sometimes it occurs during the day. Usually last 1-2 hours, resolves by itself. Associated symptoms palpitations and lightheadedness. Denies SHOB or dizziness. Denies being seen by cardiologist. She is uncertain if the her chest pain is related to anxiety. She reports she previously had some of the same symptoms when taking Adipex.   Patient is complaining of headaches. Last headache was about a week ago. She reports her headache is generalized or frontal. Denies dizziness, lightheadedness, nausea, vomiting, or light/photo sensitivity. Takes Ibuprofen with relief. She reports headaches are occurring about 3 times a week.   Outpatient Encounter Medications as of 11/26/2022  Medication Sig   Melatonin 10 MG CHEW Chew 20 mg by mouth at bedtime.   [DISCONTINUED] metroNIDAZOLE (FLAGYL) 500 MG tablet Take 1 tablet (500 mg total) by mouth 2 (two) times daily.   [DISCONTINUED] metroNIDAZOLE (METROGEL) 0.75 % vaginal gel Place I applicatorful daily at bedtim for 5 nights   [DISCONTINUED] buPROPion (WELLBUTRIN SR) 150 MG 12 hr tablet Take 1 tablet (150 mg total) by mouth 2 (two) times daily. (Patient not taking: Reported on 09/26/2020)   [DISCONTINUED] diphenhydrAMINE HCl,  Sleep, (ZZZQUIL PO) Take by mouth. (Patient not taking: Reported on 11/26/2022)   No facility-administered encounter medications on file as of 11/26/2022.    Past Medical History:  Diagnosis Date   Anxiety    Endometriosis     Past Surgical History:  Procedure Laterality Date   ABDOMINAL HYSTERECTOMY     LAPAROSCOPIC BILATERAL SALPINGECTOMY Bilateral 10/28/2019   Procedure: LAPAROSCOPIC BILATERAL SALPINGECTOMY;  Surgeon: Reva Bores, MD;  Location: [facility];  Service: Gynecology;  Laterality: Bilateral;    Family History  Problem Relation Age of Onset   Hypertension Mother    Hyperlipidemia Mother    Arthritis Mother    Multiple sclerosis Mother    Cancer Mother        Breast   Hypertension Father    Early death Father    Drug abuse Father    Heart disease Father    Heart attack Father    Diabetes Brother    Hyperlipidemia Maternal Grandmother    Heart disease Maternal Grandmother    Arthritis Maternal Grandmother    Stroke Maternal Grandfather     Social History   Socioeconomic History   Marital status: Single    Spouse name: Not on file   Number of children: 0   Years of education: Not on file   Highest education level: Associate degree: occupational, Scientist, product/process development, or vocational program  Occupational History   Not on file  Tobacco Use   Smoking status: Former    Current packs/day: 1.00    Average packs/day: 1 pack/day  for 15.0 years (15.0 ttl pk-yrs)    Types: Cigarettes   Smokeless tobacco: Never   Tobacco comments:    7-8 cigarettes /day  Vaping Use   Vaping status: Every Day  Substance and Sexual Activity   Alcohol use: Yes    Comment: Twice a month   Drug use: No   Sexual activity: Yes    Birth control/protection: None  Other Topics Concern   Not on file  Social History Narrative   Not on file   Social Determinants of Health   Financial Resource Strain: Low Risk  (11/22/2022)   Overall Financial Resource Strain (CARDIA)     Difficulty of Paying Living Expenses: Not very hard  Food Insecurity: Food Insecurity Present (11/22/2022)   Hunger Vital Sign    Worried About Running Out of Food in the Last Year: Sometimes true    Ran Out of Food in the Last Year: Sometimes true  Transportation Needs: No Transportation Needs (11/22/2022)   PRAPARE - Administrator, Civil Service (Medical): No    Lack of Transportation (Non-Medical): No  Physical Activity: Inactive (11/22/2022)   Exercise Vital Sign    Days of Exercise per Week: 2 days    Minutes of Exercise per Session: 0 min  Stress: Stress Concern Present (11/22/2022)   Megan Ponce of Occupational Health - Occupational Stress Questionnaire    Feeling of Stress : Very much  Social Connections: Moderately Isolated (11/22/2022)   Social Connection and Isolation Panel [NHANES]    Frequency of Communication with Friends and Family: More than three times a week    Frequency of Social Gatherings with Friends and Family: Once a week    Attends Religious Services: Never    Database administrator or Organizations: No    Attends Engineer, structural: Not on file    Marital Status: Living with partner  Intimate Partner Violence: Not At Risk (11/26/2022)   Humiliation, Afraid, Rape, and Kick questionnaire    Fear of Current or Ex-Partner: No    Emotionally Abused: No    Physically Abused: No    Sexually Abused: No    ROS See HPI above    Objective   BP (!) 122/90 (BP Location: Left Arm, Patient Position: Sitting, Cuff Size: Normal)   Pulse 85   Temp 98.2 F (36.8 C) (Oral)   Ht 5' 3.75" (1.619 m)   Wt 191 lb 9.6 oz (86.9 kg)   LMP 11/03/2022   SpO2 96%   BMI 33.15 kg/m   Physical Exam Vitals reviewed.  Constitutional:      General: She is not in acute distress.    Appearance: Normal appearance. She is obese. She is not ill-appearing, toxic-appearing or diaphoretic.  HENT:     Head: Normocephalic and atraumatic.  Eyes:      General:        Right eye: No discharge.        Left eye: No discharge.     Extraocular Movements:     Right eye: Normal extraocular motion and no nystagmus.     Left eye: Normal extraocular motion and no nystagmus.     Conjunctiva/sclera: Conjunctivae normal.     Pupils: Pupils are equal, round, and reactive to light.  Cardiovascular:     Rate and Rhythm: Normal rate and regular rhythm.     Heart sounds: Normal heart sounds. No murmur heard.    No friction rub. No gallop.  Pulmonary:  Effort: Pulmonary effort is normal. No respiratory distress.     Breath sounds: Normal breath sounds.  Musculoskeletal:        General: Normal range of motion.     Cervical back: Normal range of motion.  Skin:    General: Skin is warm and dry.  Neurological:     General: No focal deficit present.     Mental Status: She is alert and oriented to person, place, and time. Mental status is at baseline.     Cranial Nerves: Cranial nerves 2-12 are intact.     Motor: No weakness.     Coordination: Coordination is intact.     Gait: Gait normal.  Psychiatric:        Mood and Affect: Mood normal.        Behavior: Behavior normal.        Thought Content: Thought content normal.        Judgment: Judgment normal.   EKG completed today for chest pain and palpitations. NSR 84 with no ST elevation. No comparison of a prior EKG.    Assessment & Plan:  Chest pain, unspecified type -     EKG 12-Lead -     CBC with Differential/Platelet -     Comprehensive metabolic panel -     Magnesium -     TSH -     LONG TERM MONITOR (3-14 DAYS); Future  Palpitations -     Magnesium -     TSH -     LONG TERM MONITOR (3-14 DAYS); Future  Acute nonintractable headache, unspecified headache type -     CBC with Differential/Platelet -     Magnesium -     TSH -     Sedimentation rate  Obesity due to excess calories without serious comorbidity, unspecified class -     Hemoglobin A1c -     Lipid panel  Influenza  vaccine needed -     Flu vaccine trivalent PF, 6mos and older(Flulaval,Afluria,Fluarix,Fluzone)  Encounter for screening mammogram for malignant neoplasm of breast -     3D Screening Mammogram, Left and Right; Future  Family history of breast cancer in female -     3D Screening Mammogram, Left and Right; Future  Encounter to establish care   1.Review health maintenance:  -Covid booster: A few weeks ago; nursing home  -Influenza vaccine: Ordered and administered  -Mammogram: Ordered mammogram with a family history of breast cancer.  2.EKG completed today for chest pain and palpitations. No concerns.  3.Ordered labs: CMP, Magnesium, TSH, CBC with Diff, Lipid, Sed Rate, and A1c for reason of headache, chest pain, or based on BMI.  4.Ordered portable cardiac monitor for 14 days. Cardiology will notify of results once monitor is returned.  Return in about 1 month (around 12/27/2022) for follow-up.   Zandra Abts, NP

## 2022-11-26 NOTE — Progress Notes (Unsigned)
EP to read

## 2022-11-27 ENCOUNTER — Other Ambulatory Visit (HOSPITAL_COMMUNITY)
Admission: RE | Admit: 2022-11-27 | Discharge: 2022-11-27 | Disposition: A | Discharge status: Home / Self Care | Payer: PRIVATE HEALTH INSURANCE | Source: Ambulatory Visit | Attending: Obstetrics and Gynecology | Admitting: Obstetrics and Gynecology

## 2022-11-27 ENCOUNTER — Ambulatory Visit (INDEPENDENT_AMBULATORY_CARE_PROVIDER_SITE_OTHER): Payer: No Typology Code available for payment source | Admitting: Obstetrics and Gynecology

## 2022-11-27 ENCOUNTER — Other Ambulatory Visit: Payer: Self-pay

## 2022-11-27 ENCOUNTER — Encounter: Payer: Self-pay | Admitting: Obstetrics and Gynecology

## 2022-11-27 VITALS — BP 123/88 | HR 82 | Wt 188.4 lb

## 2022-11-27 DIAGNOSIS — Z1330 Encounter for screening examination for mental health and behavioral disorders, unspecified: Secondary | ICD-10-CM | POA: Diagnosis not present

## 2022-11-27 DIAGNOSIS — Z01419 Encounter for gynecological examination (general) (routine) without abnormal findings: Secondary | ICD-10-CM | POA: Diagnosis not present

## 2022-11-27 DIAGNOSIS — Z124 Encounter for screening for malignant neoplasm of cervix: Secondary | ICD-10-CM | POA: Insufficient documentation

## 2022-11-27 DIAGNOSIS — N898 Other specified noninflammatory disorders of vagina: Secondary | ICD-10-CM

## 2022-11-27 LAB — COMPREHENSIVE METABOLIC PANEL
ALT: 16 U/L (ref 0–35)
AST: 15 U/L (ref 0–37)
Albumin: 4.3 g/dL (ref 3.5–5.2)
Alkaline Phosphatase: 73 U/L (ref 39–117)
BUN: 17 mg/dL (ref 6–23)
CO2: 24 meq/L (ref 19–32)
Calcium: 9.3 mg/dL (ref 8.4–10.5)
Chloride: 105 meq/L (ref 96–112)
Creatinine, Ser: 0.97 mg/dL (ref 0.40–1.20)
GFR: 72.24 mL/min (ref >60.00)
Glucose, Bld: 70 mg/dL (ref 70–99)
Potassium: 3.9 meq/L (ref 3.5–5.1)
Sodium: 139 meq/L (ref 135–145)
Total Bilirubin: 0.5 mg/dL (ref 0.2–1.2)
Total Protein: 7.5 g/dL (ref 6.0–8.3)

## 2022-11-27 LAB — HEMOGLOBIN A1C: Hgb A1c MFr Bld: 5.7 % (ref 4.6–6.5)

## 2022-11-27 LAB — LIPID PANEL
Cholesterol: 235 mg/dL — ABNORMAL HIGH (ref 0–200)
HDL: 78.4 mg/dL (ref >39.00)
LDL Cholesterol: 126 mg/dL — ABNORMAL HIGH (ref 0–99)
NonHDL: 156.41
Total CHOL/HDL Ratio: 3
Triglycerides: 154 mg/dL — ABNORMAL HIGH (ref 0.0–149.0)
VLDL: 30.8 mg/dL (ref 0.0–40.0)

## 2022-11-27 LAB — CBC WITH DIFFERENTIAL/PLATELET
Basophils Absolute: 0 10*3/uL (ref 0.0–0.1)
Basophils Relative: 0.7 % (ref 0.0–3.0)
Eosinophils Absolute: 0.1 10*3/uL (ref 0.0–0.7)
Eosinophils Relative: 1.2 % (ref 0.0–5.0)
HCT: 48 % — ABNORMAL HIGH (ref 36.0–46.0)
Hemoglobin: 15.5 g/dL — ABNORMAL HIGH (ref 12.0–15.0)
Lymphocytes Relative: 26.9 % (ref 12.0–46.0)
Lymphs Abs: 1.7 10*3/uL (ref 0.7–4.0)
MCHC: 32.3 g/dL (ref 30.0–36.0)
MCV: 90.5 fL (ref 78.0–100.0)
Monocytes Absolute: 0.2 10*3/uL (ref 0.1–1.0)
Monocytes Relative: 3.6 % (ref 3.0–12.0)
Neutro Abs: 4.3 10*3/uL (ref 1.4–7.7)
Neutrophils Relative %: 67.6 % (ref 43.0–77.0)
Platelets: 344 10*3/uL (ref 150.0–400.0)
RBC: 5.3 Mil/uL — ABNORMAL HIGH (ref 3.87–5.11)
RDW: 13.5 % (ref 11.5–15.5)
WBC: 6.4 10*3/uL (ref 4.0–10.5)

## 2022-11-27 LAB — SEDIMENTATION RATE: Sed Rate: 32 mm/h — ABNORMAL HIGH (ref 0–20)

## 2022-11-27 LAB — MAGNESIUM: Magnesium: 2.2 mg/dL (ref 1.5–2.5)

## 2022-11-27 LAB — TSH: TSH: 1.1 u[IU]/mL (ref 0.35–5.50)

## 2022-11-27 NOTE — Progress Notes (Signed)
ANNUAL EXAM Patient name: Megan Ponce MRN [id number]  Date of birth: [birth year] Chief Complaint:   Gynecologic Exam  History of Present Illness:   Megan Ponce is a 42 y.o. G0P0000 being seen today for a routine annual exam.  Current complaints: annual, vaginal discharge  Menstrual concerns? No  menses are getting lighter these last few months, no new pain or other changes  Breast or nipple changes? No  Contraception use? No prior salpingectomy Sexually active? Yes female  She has been experiencing vaginal discharge without odor or itching. She can usually tell when she has BV and this does not feel like it. She wants to check for yeast/BV just in case.   Patient's last menstrual period was 11/03/2022.   The pregnancy intention screening data noted above was reviewed. Potential methods of contraception were discussed. The patient elected to proceed with No data recorded.   Last pap     Component Value Date/Time   DIAGPAP  09/25/2018 0000    NEGATIVE FOR INTRAEPITHELIAL LESIONS OR MALIGNANCY.   ADEQPAP  09/25/2018 0000    Satisfactory for evaluation  endocervical/transformation zone component PRESENT.     H/O abnormal pap: no Last mammogram: scheduled.  Last colonoscopy: n/a.      11/26/2022    3:57 PM 09/26/2020    3:30 PM 11/30/2019    9:44 AM 08/27/2019    4:10 PM 07/29/2019   11:00 AM  Depression screen PHQ 2/9  Decreased Interest 0 0 0 0 1  Down, Depressed, Hopeless 2 0 0 1 1  PHQ - 2 Score 2 0 0 1 2  Altered sleeping 3 2 1  0 2  Tired, decreased energy 2 2 1 3 3   Change in appetite 1 0 0 0 0  Feeling bad or failure about yourself  1 0 1 1 1   Trouble concentrating 0 0 0 0 1  Moving slowly or fidgety/restless 0 0 0 0 0  Suicidal thoughts 1 0 0 1 0  PHQ-9 Score 10 4 3 6 9   Difficult doing work/chores Somewhat difficult            11/26/2022    3:58 PM 09/26/2020    3:30 PM 11/30/2019    9:44 AM 08/27/2019    4:12 PM  GAD 7 : Generalized Anxiety Score   Nervous, Anxious, on Edge 2 1 0 1  Control/stop worrying 2 1 0 3  Worry too much - different things 2 1 1  0  Trouble relaxing 3 1 1  0  Restless 1 0 0 0  Easily annoyed or irritable 1 1 1 1   Afraid - awful might happen 2 1 1 1   Total GAD 7 Score 13 6 4 6      Review of Systems:   Pertinent items are noted in HPI Denies any headaches, blurred vision, fatigue, shortness of breath, chest pain, abdominal pain, abnormal vaginal discharge/itching/odor/irritation, problems with periods, bowel movements, urination, or intercourse unless otherwise stated above. Pertinent History Reviewed:  Reviewed past medical,surgical, social and family history.  Reviewed problem list, medications and allergies. Physical Assessment:   Vitals:   11/27/22 1127  BP: 123/88  Pulse: 82  Weight: 188 lb 6.4 oz (85.5 kg)  Body mass index is 32.59 kg/m.        Physical Examination:   General appearance - well appearing, and in no distress  Mental status - alert, oriented to person, place, and time  Psych:  She has a normal mood and affect  Skin - warm and dry, normal color, no suspicious lesions noted  Chest - effort normal, all lung fields clear to auscultation bilaterally  Heart - normal rate and regular rhythm  Abdomen - soft, nontender, nondistended, no masses or organomegaly  Pelvic -  VULVA: normal appearing vulva with no masses, tenderness or lesions   VAGINA: normal appearing vagina with normal color and discharge, no lesions   CERVIX: normal appearing cervix without discharge or lesions, no CMT  Thin prep pap is done with HR HPV cotesting  UTERUS: uterus is felt to be normal size, shape, consistency and nontender   ADNEXA: No adnexal masses or tenderness noted.  Extremities:  No swelling or varicosities noted  Chaperone present for exam  No results found for this or any previous visit (from the past 24 hour(s)).    Assessment & Plan:  1. Well woman exam with routine gynecological exam -  Cervical cancer screening: Discussed guidelines. Pap with HPV collected - STD Testing: declines - Birth Control: Discussed options and their risks, benefits and common side effects; discussed VTE with estrogen containing options. Desires:  s/p bilateral salpingectomy - Breast Health: Encouraged self breast awareness/SBE. Teaching provided. Discussed limits of clinical breast exam for detecting breast cancer.  scheduled - F/U 12 months and prn   2. Screening for cervical cancer Pap collected - Cytology - PAP  3. Vaginal discharge Vaginitis swab collected - Cervicovaginal ancillary only       No orders of the defined types were placed in this encounter.   Meds: No orders of the defined types were placed in this encounter.   Follow-up: No follow-ups on file.  Lorriane Shire, MD 11/27/2022 11:42 AM

## 2022-11-28 DIAGNOSIS — R002 Palpitations: Secondary | ICD-10-CM | POA: Diagnosis not present

## 2022-11-28 DIAGNOSIS — R079 Chest pain, unspecified: Secondary | ICD-10-CM | POA: Diagnosis not present

## 2022-11-28 LAB — CERVICOVAGINAL ANCILLARY ONLY
Bacterial Vaginitis (gardnerella): NEGATIVE
Candida Glabrata: NEGATIVE
Candida Vaginitis: NEGATIVE
Comment: NEGATIVE
Comment: NEGATIVE
Comment: NEGATIVE

## 2022-12-03 LAB — CYTOLOGY - PAP
Comment: NEGATIVE
Diagnosis: NEGATIVE
High risk HPV: NEGATIVE

## 2022-12-25 ENCOUNTER — Ambulatory Visit: Payer: No Typology Code available for payment source

## 2023-01-02 ENCOUNTER — Ambulatory Visit: Payer: No Typology Code available for payment source | Admitting: Family Medicine

## 2023-01-24 ENCOUNTER — Ambulatory Visit: Payer: No Typology Code available for payment source

## 2023-02-07 ENCOUNTER — Ambulatory Visit
Admission: RE | Admit: 2023-02-07 | Discharge: 2023-02-07 | Disposition: A | Discharge status: Home / Self Care | Payer: BC Managed Care – PPO | Source: Ambulatory Visit | Attending: Family Medicine | Admitting: Family Medicine

## 2023-02-07 DIAGNOSIS — Z803 Family history of malignant neoplasm of breast: Secondary | ICD-10-CM

## 2023-02-07 DIAGNOSIS — Z1231 Encounter for screening mammogram for malignant neoplasm of breast: Secondary | ICD-10-CM

## 2023-03-04 ENCOUNTER — Encounter: Payer: Self-pay | Admitting: Family Medicine

## 2023-04-01 ENCOUNTER — Ambulatory Visit: Payer: BC Managed Care – PPO | Admitting: Family Medicine

## 2023-04-01 VITALS — BP 94/70 | HR 84 | Temp 98.3°F | Ht 63.75 in | Wt 200.6 lb

## 2023-04-01 DIAGNOSIS — K5900 Constipation, unspecified: Secondary | ICD-10-CM | POA: Diagnosis not present

## 2023-04-01 DIAGNOSIS — G4700 Insomnia, unspecified: Secondary | ICD-10-CM | POA: Diagnosis not present

## 2023-04-01 MED ORDER — HYDROXYZINE PAMOATE 50 MG PO CAPS: ORAL_CAPSULE | ORAL | 0 refills | Status: AC

## 2023-04-01 NOTE — Patient Instructions (Addendum)
-  Discussed about constipation. Ways to improve constipation by increasing fiber into diet and increasing water intake. May need to take a stool softner or Miralax often (daily, every other day). Provided information about constipation and eating a high fiber diet.  -Prescribed Hydroxyzine 50mg  tablet, take 1-2 tablets at bedtime for insomnia.  -Discussed about insomnia, good habits of how to go to sleep.  -Follow up in 1 month to see how insomnia and constipation are doing.

## 2023-04-01 NOTE — Progress Notes (Signed)
 Established Patient Office Visit   Subjective:  Patient ID: Megan Ponce, female    DOB: December 01, 1980  Age: 43 y.o. MRN: [id number]  Chief Complaint  Patient presents with   Medical Management of Chronic Issues    Pt reports the cardiology sent the monitor result- "everything was fine"   Constipation    Pt c/o constipation. Pt states it has been a long time. Not taking anything for it. Every now and then takes Laxative.   Insomnia    Pt c/o trouble sleeping, been going on a month now. Taking melatonin, Zquil. Pt states sometimes the medication is not helping.     Constipation  Insomnia   Constipation: Patient is complaining of constipation. She reports it started to get worse a few months ago. She reports is having a hard bowel movement every 4 days. Patient reports she has tried Metamucil and drinking water helps her have a bowel movement. Usually will have a bowel movement in about 2 days. She is aware that she does not always drink as much water as she should. Denies blood in stools or dark tarry stools. Denies any diet changes in the months leading to constipation. Before having severe constipation these last few months, she would still have episodes of constipation, but less hard and would have a bowel movement every 2 days. She reports when she is constipated, she will have bloating and abd cramping. Never had a colonoscopy or seen GI for symptoms.   Insomnia: Patient is complaining of insomnia. Patient has been taking Melatonin, taking 3 12 mg gummies at night. Sometimes it will help and other times it will not. If Melatonin does not help, she will try Zquil that provides her the same results as Melatonin. She reports this started a couple of months ago. She reports her routine for going to sleep is to lay down in bed around 10 pm, watch television 45 minutes to 1 hour, then she is able to drift off to sleep. She reports her problem is staying a sleep. She usually wakes up around 2  am, then falls back to sleep around 5 am. She is uncertain what wakes her up at 2 am. Then, gets up around 7-7:30 am for the day.  Review of Systems  Gastrointestinal:  Positive for constipation.  Psychiatric/Behavioral:  The patient has insomnia.    See HPI above     Objective:   BP 94/70 (BP Location: Right Arm, Patient Position: Sitting, Cuff Size: Large)   Pulse 84   Temp 98.3 F (36.8 C) (Oral)   Ht 5' 3.75" (1.619 m)   Wt 200 lb 9.6 oz (91 kg)   LMP 12/02/2022 (Approximate)   SpO2 96%   BMI 34.70 kg/m    Physical Exam Vitals reviewed.  Constitutional:      General: She is not in acute distress.    Appearance: Normal appearance. She is obese. She is not ill-appearing, toxic-appearing or diaphoretic.  HENT:     Head: Normocephalic and atraumatic.  Eyes:     General:        Right eye: No discharge.        Left eye: No discharge.     Conjunctiva/sclera: Conjunctivae normal.  Cardiovascular:     Rate and Rhythm: Normal rate and regular rhythm.     Heart sounds: Normal heart sounds. No murmur heard.    No friction rub. No gallop.  Pulmonary:     Effort: Pulmonary effort is normal. No respiratory distress.  Breath sounds: Normal breath sounds.  Abdominal:     General: Bowel sounds are normal. There is no distension.     Palpations: Abdomen is soft. There is no mass.     Tenderness: There is no abdominal tenderness.  Musculoskeletal:        General: Normal range of motion.  Skin:    General: Skin is warm and dry.  Neurological:     General: No focal deficit present.     Mental Status: She is alert and oriented to person, place, and time. Mental status is at baseline.  Psychiatric:        Mood and Affect: Mood normal.        Behavior: Behavior normal.        Thought Content: Thought content normal.        Judgment: Judgment normal.      Assessment & Plan:  Insomnia, unspecified type -     hydrOXYzine Pamoate; Take 1-2 tablets at bedtime for insomnia.   Dispense: 30 capsule; Refill: 0  Constipation, unspecified constipation type  -Discussed about constipation. Ways to improve constipation by increasing fiber into diet and increasing water intake. May need to take a stool softner or Miralax often (daily, every other day). Provided information about constipation and eating a high fiber diet.  -Prescribed Hydroxyzine 50mg  tablet, take 1-2 tablets at bedtime for insomnia.  -Discussed about insomnia, good habits of how to go to sleep.   Return in about 1 month (around 05/02/2023) for follow-up.   Zandra Abts, NP

## 2023-05-06 ENCOUNTER — Ambulatory Visit: Admitting: Family Medicine

## 2023-12-20 ENCOUNTER — Encounter: Payer: Self-pay | Admitting: Obstetrics and Gynecology

## 2024-01-08 ENCOUNTER — Ambulatory Visit: Admitting: Orthopaedic Surgery

## 2024-01-27 ENCOUNTER — Encounter: Payer: Self-pay | Admitting: Family Medicine

## 2024-02-10 ENCOUNTER — Telehealth: Admitting: Obstetrics and Gynecology

## 2024-02-10 DIAGNOSIS — N951 Menopausal and female climacteric states: Secondary | ICD-10-CM | POA: Diagnosis not present

## 2024-02-10 NOTE — Progress Notes (Signed)
 "   GYNECOLOGY VIRTUAL VISIT ENCOUNTER NOTE  Provider location: [facility]   Patient location: Home  I connected with Megan Ponce on 02/10/2024 at  3:35 PM EST by MyChart Video Encounter and verified that I am speaking with the correct person using two identifiers.   I discussed the limitations, risks, security and privacy concerns of performing an evaluation and management service virtually and the availability of in person appointments. I also discussed with the patient that there may be a patient responsible charge related to this service. The patient expressed understanding and agreed to proceed.   History:  Megan Ponce is a 44 y.o. G0P0000 female being evaluated today for follow up. About 1 month ago reached out with menopause symptoms - hot flashes, night sweats wosening and poor sleep and constipation and heaaches. Had a hysterectomy. Last period was July of 2025, currently sexually active and no pain with intercourse. Has not tried anything OTC to help with the hot flashes. Concerned constipation may be part of menopause and has been taking miralax  and milk of mag and takes a few days to help. Has hydroxyzine  but that has not been helping. Will take 2 and still up for most of the night. Primarily in the neck/face and up. Will get hot for a few minutes at a tie.  Vapes currently . No prior diagnosiss of HTN, HLD, or T2DM. No personal hx of MI or stroke. Did a priro cardiac monistor for palpataitons that came back normal. Family hx of menopause: thinks mom may have been in the 2s. Feels like can handle the hot flashes during the day but mostly concerned about sleep and concerned has had       Past Medical History:  Diagnosis Date   Anxiety    Endometriosis    Past Surgical History:  Procedure Laterality Date   ABDOMINAL HYSTERECTOMY     LAPAROSCOPIC BILATERAL SALPINGECTOMY Bilateral 10/28/2019   Procedure: LAPAROSCOPIC BILATERAL  SALPINGECTOMY;  Surgeon: Fredirick Glenys RAMAN, MD;  [facility]:  [facility];  Service: Gynecology;  Laterality: Bilateral;   The following portions of the patient's history were reviewed and updated as appropriate: allergies, current medications, past family history, past medical history, past social history, past surgical history and problem list.   Health Maintenance:      Component Value Date/Time   DIAGPAP  11/27/2022 1153    - Negative for intraepithelial lesion or malignancy (NILM)   DIAGPAP  09/25/2018 0000    NEGATIVE FOR INTRAEPITHELIAL LESIONS OR MALIGNANCY.   HPVHIGH Negative 11/27/2022 1153   ADEQPAP  11/27/2022 1153    Satisfactory for evaluation; transformation zone component PRESENT.   ADEQPAP  09/25/2018 0000    Satisfactory for evaluation  endocervical/transformation zone component PRESENT.   SABRA  Normal mammogram on 01/2023 BIRADS 1.   Review of Systems:  Pertinent items noted in HPI and remainder of comprehensive ROS otherwise negative.  Physical Exam:   General:  Alert, oriented and cooperative. Patient appears to be in no acute distress.  Mental Status: Normal mood and affect. Normal behavior. Normal judgment and thought content.   Respiratory: Normal respiratory effort, no problems with respiration noted  Rest of physical exam deferred due to type of encounter  Assessment and Plan:     1. Perimenopausal vasomotor symptoms (Primary) Ok with watching for now. Maurine is helpful. Feels they have been improving. If there are more concerns will try that for a while. Given additional  information about menopause in AVS       I discussed the assessment and treatment plan with the patient. The patient was provided an opportunity to ask questions and all were answered. The patient agreed with the plan and demonstrated an understanding of the instructions.   The patient was advised to call back or seek an in-person evaluation/go to the ED if the symptoms worsen or if  the condition fails to improve as anticipated.  I provided 5 minutes of face-to-face time during this encounter. I also spent 10 minutes dedicated to the care of this patient including pre-visit review of records, post visit ordering of medications and appropriate tests or procedures, coordinating care and documenting this visit encounter.    Carter Quarry, MD [facility] [employer] "

## 2024-02-10 NOTE — Patient Instructions (Addendum)
[email or web address]
